# Patient Record
Sex: Female | Born: 1980 | Race: White | Hispanic: No | Marital: Married | Smoking: Never smoker
Health system: Southern US, Community
[De-identification: ages and names within clinical notes are randomized; demographics above are authoritative.]

## PROBLEM LIST (undated history)

## (undated) DIAGNOSIS — F32A Depression, unspecified: Secondary | ICD-10-CM

## (undated) DIAGNOSIS — R251 Tremor, unspecified: Secondary | ICD-10-CM

## (undated) DIAGNOSIS — T7840XA Allergy, unspecified, initial encounter: Secondary | ICD-10-CM

## (undated) DIAGNOSIS — E039 Hypothyroidism, unspecified: Secondary | ICD-10-CM

## (undated) DIAGNOSIS — Z87442 Personal history of urinary calculi: Secondary | ICD-10-CM

## (undated) DIAGNOSIS — F419 Anxiety disorder, unspecified: Secondary | ICD-10-CM

## (undated) DIAGNOSIS — M519 Unspecified thoracic, thoracolumbar and lumbosacral intervertebral disc disorder: Secondary | ICD-10-CM

## (undated) DIAGNOSIS — O039 Complete or unspecified spontaneous abortion without complication: Secondary | ICD-10-CM

## (undated) DIAGNOSIS — F329 Major depressive disorder, single episode, unspecified: Secondary | ICD-10-CM

## (undated) HISTORY — DX: Personal history of urinary calculi: Z87.442

## (undated) HISTORY — DX: Allergy, unspecified, initial encounter: T78.40XA

## (undated) HISTORY — PX: TUBAL LIGATION: SHX77

## (undated) HISTORY — DX: Unspecified thoracic, thoracolumbar and lumbosacral intervertebral disc disorder: M51.9

## (undated) HISTORY — PX: MOLE REMOVAL: SHX2046

## (undated) HISTORY — PX: LITHOTRIPSY: SUR834

## (undated) HISTORY — DX: Hypothyroidism, unspecified: E03.9

---
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
## 2004-06-22 ENCOUNTER — Other Ambulatory Visit: Admission: RE | Admit: 2004-06-22 | Discharge: 2004-06-22 | Payer: Self-pay | Admitting: Obstetrics and Gynecology

## 2006-01-09 ENCOUNTER — Inpatient Hospital Stay (HOSPITAL_COMMUNITY): Admission: AD | Admit: 2006-01-09 | Discharge: 2006-01-09 | Payer: Self-pay | Admitting: Internal Medicine

## 2006-03-08 ENCOUNTER — Inpatient Hospital Stay (HOSPITAL_COMMUNITY): Admission: AD | Admit: 2006-03-08 | Discharge: 2006-03-08 | Payer: Self-pay | Admitting: Obstetrics and Gynecology

## 2006-04-02 ENCOUNTER — Inpatient Hospital Stay (HOSPITAL_COMMUNITY): Admission: AD | Admit: 2006-04-02 | Discharge: 2006-04-04 | Payer: Self-pay | Admitting: Obstetrics and Gynecology

## 2006-05-18 ENCOUNTER — Emergency Department (HOSPITAL_COMMUNITY): Admission: EM | Admit: 2006-05-18 | Discharge: 2006-05-18 | Payer: Self-pay | Admitting: Emergency Medicine

## 2010-06-09 ENCOUNTER — Ambulatory Visit: Payer: Self-pay | Admitting: Obstetrics and Gynecology

## 2010-06-09 ENCOUNTER — Inpatient Hospital Stay (HOSPITAL_COMMUNITY): Admission: AD | Admit: 2010-06-09 | Discharge: 2010-06-10 | Payer: Self-pay | Admitting: Obstetrics and Gynecology

## 2010-12-02 NOTE — L&D Delivery Note (Signed)
Delivery Note Pt progressed rapidly to complete dilation after epidural.  At 12:37 PM a healthy female was delivered via  (Presentation: ;ROA).  APGAR: 8,9  weight 8 lb 15 oz (4054 g).   Placenta status  Delivered spontaneously and trailing membranes removed: , . Cord blood collected for donation.  Anesthesia: Epidural  Episiotomy: n/a Lacerations: none Suture Repair: n/a Est. Blood Loss (mL): 400cc  Mom to postpartum.  Baby to nursery-stable.  Julie Chambers 11/06/2011, 12:53 PM

## 2011-02-17 LAB — CBC
HCT: 36.4 % (ref 36.0–46.0)
Hemoglobin: 12.4 g/dL (ref 12.0–15.0)
MCV: 85.6 fL (ref 78.0–100.0)
Platelets: 212 10*3/uL (ref 150–400)
RDW: 13.9 % (ref 11.5–15.5)

## 2011-02-17 LAB — RH IMMUNE GLOBULIN WORKUP (NOT WOMEN'S HOSP)
ABO/RH(D): O NEG
Antibody Screen: NEGATIVE

## 2011-02-17 LAB — POCT PREGNANCY, URINE: Preg Test, Ur: POSITIVE

## 2011-04-08 LAB — RPR: RPR: NONREACTIVE

## 2011-04-08 LAB — GC/CHLAMYDIA PROBE AMP, GENITAL
Chlamydia: NEGATIVE
Gonorrhea: NEGATIVE

## 2011-04-08 LAB — ABO/RH: RH Type: NEGATIVE

## 2011-04-08 LAB — HIV ANTIBODY (ROUTINE TESTING W REFLEX): HIV: NONREACTIVE

## 2011-04-19 NOTE — Discharge Summary (Signed)
Julie Chambers, Julie Chambers                 ACCOUNT NO.:  [id number]   MEDICAL RECORD NO.:  [id number]          PATIENT TYPE:  INP   LOCATION:  9140                          FACILITY:  [facility]   PHYSICIAN:  Huel Cote, M.D. DATE OF BIRTH:  1980-12-30   DATE OF ADMISSION:  04/02/2006  DATE OF DISCHARGE:  04/04/2006                                 DISCHARGE SUMMARY   DISCHARGE DIAGNOSES:  1.  Term pregnancy at 40-2/7 weeks delivered.  2.  Status post normal spontaneous vaginal delivery.   DISCHARGE MEDICATIONS:  1.  Motrin 600 mg p.o. every 6 hours.  2.  Percocet 1-2 tablets p.o. every 4 hours p.r.n.   DISCHARGE FOLLOWUP:  The patient is to follow-up in the office in  approximately 6 weeks for her routine postpartum exam.   HOSPITAL COURSE:  The patient is a 30 year old, G1, P0 who was admitted at  40-2/7 weeks' gestation for induction of labor given post due date and  favorable cervix.  Prenatal care was complicated by hypothyroidism and  depression which were both stable on medications.  Prenatal labs are as  follows:  O negative, antibody negative, RPR nonreactive, rubella immune,  hepatitis B surface antigen negative, GC negative, chlamydia negative, one-  hour Glucola 108, group B strep negative.   PAST OBSTETRICAL HISTORY:  None.   PAST GYNECOLOGICAL HISTORY:  None.   PAST SURGICAL HISTORY:  None.   PAST MEDICAL HISTORY:  Hypothyroidism and depression.   ALLERGIES:  CODEINE.   MEDICATIONS INCLUDED:  1.  Wellbutrin 150 mg q.d.  2.  Levothroid 150 mcg q.d.   SUMMARY:  On admission, she was afebrile with stable vital signs.  Fetal  heart rate was reactive.  Cervix was 50, 1+ and -2 station.  She had rupture  of membranes performed and was placed on Pitocin.  She progressed well  throughout the day, reached complete dilation and pushed with normal  spontaneous vaginal delivery of a vigorous female infant over a first-degree  laceration.  Apgars were 8 and 9, weight was 8  pounds 10 ounces.  Placenta  delivered spontaneously.  A first-degree laceration was repaired 2-0 Vicryl  for hemostasis.  Estimated blood loss  was 350.  Cervix and rectum were intact.  The patient did very well.  Postpartum day #2, she had no complaints.  She was afebrile with stable  vital signs.  Discharge hemoglobin was 11.  Fundus was firm and lochia  normal.  Therefore, she was felt stable for discharge home and would  discharge for follow-up in six weeks.      Huel Cote, M.D.  Electronically Signed     KR/MEDQ  D:  04/28/2006  T:  04/28/2006  Job:  [id number]

## 2011-10-07 LAB — STREP B DNA PROBE: GBS: NEGATIVE

## 2011-11-01 ENCOUNTER — Encounter (HOSPITAL_COMMUNITY): Payer: Self-pay | Admitting: *Deleted

## 2011-11-01 ENCOUNTER — Telehealth (HOSPITAL_COMMUNITY): Payer: Self-pay | Admitting: *Deleted

## 2011-11-01 NOTE — Telephone Encounter (Signed)
Preadmission screen  

## 2011-11-05 ENCOUNTER — Other Ambulatory Visit: Payer: Self-pay | Admitting: Obstetrics and Gynecology

## 2011-11-06 ENCOUNTER — Encounter (HOSPITAL_COMMUNITY): Payer: Self-pay

## 2011-11-06 ENCOUNTER — Inpatient Hospital Stay (HOSPITAL_COMMUNITY)
Admission: RE | Admit: 2011-11-06 | Discharge: 2011-11-08 | DRG: 775 | Disposition: A | Discharge status: Home / Self Care | Payer: 59 | Source: Ambulatory Visit | Attending: Obstetrics and Gynecology | Admitting: Obstetrics and Gynecology

## 2011-11-06 ENCOUNTER — Encounter (HOSPITAL_COMMUNITY): Payer: Self-pay | Admitting: Anesthesiology

## 2011-11-06 ENCOUNTER — Inpatient Hospital Stay (HOSPITAL_COMMUNITY): Payer: 59 | Admitting: Anesthesiology

## 2011-11-06 DIAGNOSIS — O99284 Endocrine, nutritional and metabolic diseases complicating childbirth: Principal | ICD-10-CM | POA: Diagnosis present

## 2011-11-06 DIAGNOSIS — E079 Disorder of thyroid, unspecified: Principal | ICD-10-CM | POA: Diagnosis present

## 2011-11-06 DIAGNOSIS — E039 Hypothyroidism, unspecified: Secondary | ICD-10-CM | POA: Diagnosis present

## 2011-11-06 LAB — CBC
HCT: 33.6 % — ABNORMAL LOW (ref 36.0–46.0)
Hemoglobin: 11.7 g/dL — ABNORMAL LOW (ref 12.0–15.0)
MCH: 29.8 pg (ref 26.0–34.0)
MCHC: 34.8 g/dL (ref 30.0–36.0)
MCV: 85.7 fL (ref 78.0–100.0)
RDW: 13.5 % (ref 11.5–15.5)

## 2011-11-06 MED ORDER — IBUPROFEN 600 MG PO TABS: 600.0000 mg | ORAL_TABLET | Freq: Four times a day (QID) | ORAL | Status: DC | PRN

## 2011-11-06 MED ORDER — LANOLIN HYDROUS EX OINT: TOPICAL_OINTMENT | CUTANEOUS | Status: DC | PRN

## 2011-11-06 MED ORDER — RHO D IMMUNE GLOBULIN 1500 UNIT/2ML IJ SOLN
300.0000 ug | Freq: Once | INTRAMUSCULAR | Status: AC
  Administered 2011-11-07: 300 ug via INTRAMUSCULAR
  Filled 2011-11-06: qty 2

## 2011-11-06 MED ORDER — LACTATED RINGERS IV SOLN
INTRAVENOUS | Status: DC
  Administered 2011-11-06 (×3): via INTRAVENOUS

## 2011-11-06 MED ORDER — EPHEDRINE 5 MG/ML INJ: 10.0000 mg | INTRAVENOUS | Status: DC | PRN

## 2011-11-06 MED ORDER — LACTATED RINGERS IV SOLN: 500.0000 mL | Freq: Once | INTRAVENOUS | Status: DC

## 2011-11-06 MED ORDER — TERBUTALINE SULFATE 1 MG/ML IJ SOLN: 0.2500 mg | Freq: Once | INTRAMUSCULAR | Status: DC | PRN

## 2011-11-06 MED ORDER — DIBUCAINE 1 % RE OINT: 1.0000 "application " | TOPICAL_OINTMENT | RECTAL | Status: DC | PRN

## 2011-11-06 MED ORDER — OXYCODONE-ACETAMINOPHEN 5-325 MG PO TABS: 2.0000 | ORAL_TABLET | ORAL | Status: DC | PRN

## 2011-11-06 MED ORDER — OXYTOCIN 20 UNITS IN LACTATED RINGERS INFUSION - SIMPLE
1.0000 m[IU]/min | INTRAVENOUS | Status: DC
  Administered 2011-11-06: 333 m[IU]/min via INTRAVENOUS
  Administered 2011-11-06: 6 m[IU]/min via INTRAVENOUS
  Administered 2011-11-06: 2 m[IU]/min via INTRAVENOUS
  Filled 2011-11-06: qty 1000

## 2011-11-06 MED ORDER — ONDANSETRON HCL 4 MG PO TABS: 4.0000 mg | ORAL_TABLET | ORAL | Status: DC | PRN

## 2011-11-06 MED ORDER — WITCH HAZEL-GLYCERIN EX PADS: 1.0000 "application " | MEDICATED_PAD | CUTANEOUS | Status: DC | PRN

## 2011-11-06 MED ORDER — IBUPROFEN 600 MG PO TABS
600.0000 mg | ORAL_TABLET | Freq: Four times a day (QID) | ORAL | Status: DC
  Administered 2011-11-06 – 2011-11-08 (×7): 600 mg via ORAL
  Filled 2011-11-06 (×7): qty 1

## 2011-11-06 MED ORDER — BENZOCAINE-MENTHOL 20-0.5 % EX AERO: 1.0000 "application " | INHALATION_SPRAY | CUTANEOUS | Status: DC | PRN

## 2011-11-06 MED ORDER — ACETAMINOPHEN 325 MG PO TABS: 650.0000 mg | ORAL_TABLET | ORAL | Status: DC | PRN

## 2011-11-06 MED ORDER — ONDANSETRON HCL 4 MG/2ML IJ SOLN: 4.0000 mg | Freq: Four times a day (QID) | INTRAMUSCULAR | Status: DC | PRN

## 2011-11-06 MED ORDER — PHENYLEPHRINE 40 MCG/ML (10ML) SYRINGE FOR IV PUSH (FOR BLOOD PRESSURE SUPPORT): 80.0000 ug | PREFILLED_SYRINGE | INTRAVENOUS | Status: DC | PRN

## 2011-11-06 MED ORDER — CITRIC ACID-SODIUM CITRATE 334-500 MG/5ML PO SOLN: 30.0000 mL | ORAL | Status: DC | PRN

## 2011-11-06 MED ORDER — LIDOCAINE HCL (PF) 1 % IJ SOLN
30.0000 mL | INTRAMUSCULAR | Status: DC | PRN
  Filled 2011-11-06: qty 30

## 2011-11-06 MED ORDER — FENTANYL 2.5 MCG/ML BUPIVACAINE 1/10 % EPIDURAL INFUSION (WH - ANES)
14.0000 mL/h | INTRAMUSCULAR | Status: DC
  Filled 2011-11-06: qty 60

## 2011-11-06 MED ORDER — LIDOCAINE HCL 1.5 % IJ SOLN
INTRAMUSCULAR | Status: DC | PRN
  Administered 2011-11-06 (×2): 5 mL via EPIDURAL

## 2011-11-06 MED ORDER — LACTATED RINGERS IV SOLN
500.0000 mL | INTRAVENOUS | Status: DC | PRN
Start: 2011-11-06 — End: 2011-11-06

## 2011-11-06 MED ORDER — TETANUS-DIPHTH-ACELL PERTUSSIS 5-2.5-18.5 LF-MCG/0.5 IM SUSP
0.5000 mL | Freq: Once | INTRAMUSCULAR | Status: AC
  Administered 2011-11-07: 0.5 mL via INTRAMUSCULAR
  Filled 2011-11-06: qty 0.5

## 2011-11-06 MED ORDER — OXYCODONE-ACETAMINOPHEN 5-325 MG PO TABS: 1.0000 | ORAL_TABLET | ORAL | Status: DC | PRN

## 2011-11-06 MED ORDER — DIPHENHYDRAMINE HCL 50 MG/ML IJ SOLN: 12.5000 mg | INTRAMUSCULAR | Status: DC | PRN

## 2011-11-06 MED ORDER — LEVOTHYROXINE SODIUM 112 MCG PO TABS
224.0000 ug | ORAL_TABLET | Freq: Every day | ORAL | Status: DC
  Administered 2011-11-07 – 2011-11-08 (×2): 224 ug via ORAL
  Filled 2011-11-06 (×3): qty 2

## 2011-11-06 MED ORDER — ONDANSETRON HCL 4 MG/2ML IJ SOLN: 4.0000 mg | INTRAMUSCULAR | Status: DC | PRN

## 2011-11-06 MED ORDER — ZOLPIDEM TARTRATE 5 MG PO TABS: 5.0000 mg | ORAL_TABLET | Freq: Every evening | ORAL | Status: DC | PRN

## 2011-11-06 MED ORDER — SENNOSIDES-DOCUSATE SODIUM 8.6-50 MG PO TABS
2.0000 | ORAL_TABLET | Freq: Every day | ORAL | Status: DC
  Administered 2011-11-06 – 2011-11-07 (×2): 2 via ORAL

## 2011-11-06 MED ORDER — SIMETHICONE 80 MG PO CHEW: 80.0000 mg | CHEWABLE_TABLET | ORAL | Status: DC | PRN

## 2011-11-06 MED ORDER — OXYTOCIN BOLUS FROM INFUSION
500.0000 mL | Freq: Once | INTRAVENOUS | Status: DC
  Filled 2011-11-06: qty 500

## 2011-11-06 MED ORDER — EPHEDRINE 5 MG/ML INJ
10.0000 mg | INTRAVENOUS | Status: DC | PRN
  Filled 2011-11-06: qty 4

## 2011-11-06 MED ORDER — PRENATAL PLUS 27-1 MG PO TABS
1.0000 | ORAL_TABLET | Freq: Every day | ORAL | Status: DC
  Administered 2011-11-06 – 2011-11-08 (×3): 1 via ORAL
  Filled 2011-11-06 (×3): qty 1

## 2011-11-06 MED ORDER — OXYTOCIN 10 UNIT/ML IJ SOLN
INTRAMUSCULAR | Status: AC
  Filled 2011-11-06: qty 2

## 2011-11-06 MED ORDER — DIPHENHYDRAMINE HCL 25 MG PO CAPS: 25.0000 mg | ORAL_CAPSULE | Freq: Four times a day (QID) | ORAL | Status: DC | PRN

## 2011-11-06 MED ORDER — OXYTOCIN 20 UNITS IN LACTATED RINGERS INFUSION - SIMPLE: 125.0000 mL/h | Freq: Once | INTRAVENOUS | Status: DC

## 2011-11-06 MED ORDER — FENTANYL 2.5 MCG/ML BUPIVACAINE 1/10 % EPIDURAL INFUSION (WH - ANES)
INTRAMUSCULAR | Status: DC | PRN
  Administered 2011-11-06: 13 mL/h via EPIDURAL

## 2011-11-06 MED ORDER — FLEET ENEMA 7-19 GM/118ML RE ENEM: 1.0000 | ENEMA | RECTAL | Status: DC | PRN

## 2011-11-06 MED ORDER — PHENYLEPHRINE 40 MCG/ML (10ML) SYRINGE FOR IV PUSH (FOR BLOOD PRESSURE SUPPORT)
80.0000 ug | PREFILLED_SYRINGE | INTRAVENOUS | Status: DC | PRN
  Filled 2011-11-06: qty 5

## 2011-11-06 NOTE — Anesthesia Preprocedure Evaluation (Signed)
Anesthesia Evaluation  Patient identified by MRN, date of birth, ID band Patient awake    Reviewed: Allergy & Precautions, H&P , NPO status , Patient's Chart, lab work & pertinent test results  Airway Mallampati: II TM Distance: >3 FB Neck ROM: full    Dental No notable dental hx.    Pulmonary    Pulmonary exam normal       Cardiovascular neg cardio ROS     Neuro/Psych Negative Neurological ROS  Negative Psych ROS   GI/Hepatic negative GI ROS, Neg liver ROS,   Endo/Other  Hypothyroidism Morbid obesity  Renal/GU negative Renal ROS  Genitourinary negative   Musculoskeletal negative musculoskeletal ROS (+)   Abdominal (+) obese,   Peds negative pediatric ROS (+)  Hematology negative hematology ROS (+)   Anesthesia Other Findings   Reproductive/Obstetrics (+) Pregnancy                           Anesthesia Physical Anesthesia Plan  ASA: III  Anesthesia Plan: Epidural   Post-op Pain Management:    Induction:   Airway Management Planned:   Additional Equipment:   Intra-op Plan:   Post-operative Plan:   Informed Consent: I have reviewed the patients History and Physical, chart, labs and discussed the procedure including the risks, benefits and alternatives for the proposed anesthesia with the patient or authorized representative who has indicated his/her understanding and acceptance.     Plan Discussed with:   Anesthesia Plan Comments:         Anesthesia Quick Evaluation

## 2011-11-06 NOTE — Anesthesia Postprocedure Evaluation (Signed)
Anesthesia Post Note  Patient: Julie Chambers  Procedure(s) Performed: * No procedures listed *  Anesthesia type: Epidural  Patient location: Mother/Baby  Post pain: Pain level controlled  Post assessment: Post-op Vital signs reviewed  Last Vitals:  Filed Vitals:   11/06/11 1417  BP: 134/66  Pulse: 91  Temp:   Resp: 20    Post vital signs: Reviewed  Level of consciousness: awake  Complications: No apparent anesthesia complications

## 2011-11-06 NOTE — Anesthesia Procedure Notes (Signed)
Epidural Patient location during procedure: OB Start time: 11/06/2011 9:48 AM End time: 11/06/2011 9:54 AM Reason for block: procedure for pain  Staffing Anesthesiologist: Sandrea Hughs Performed by: anesthesiologist   Preanesthetic Checklist Completed: patient identified, site marked, surgical consent, pre-op evaluation, timeout performed, IV checked, risks and benefits discussed and monitors and equipment checked  Epidural Patient position: sitting Prep: site prepped and draped and DuraPrep Patient monitoring: continuous pulse ox and blood pressure Approach: midline Injection technique: LOR air  Needle:  Needle type: Tuohy  Needle gauge: 17 G Needle length: 9 cm Needle insertion depth: 7 cm Catheter type: closed end flexible Catheter size: 19 Gauge Catheter at skin depth: 13 cm Test dose: negative and 1.5% lidocaine  Assessment Sensory level: T8 Events: blood not aspirated, injection not painful, no injection resistance, negative IV test and no paresthesia

## 2011-11-06 NOTE — H&P (Signed)
Julie Chambers is a 30 y.o. female G3P1011 at 40 weeks (EDD 11/06/11 by 5 week [place])   presenting for induction of labor at due date with a favorable cervix.  Prenatal care has been uneventful. EFW 8 1/2-9 lbs, but pelvis tested to 8#10oz with first delivery. Rh negative and received rhogam.  History OB History    Grav Para Term Preterm Abortions TAB SAB Ect Mult Living   3 1 1  1  0 1   1    NSVD 8#10oz 2007 SAB 2011  Past Medical History  Diagnosis Date  . Asthma   . Hypothyroidism   . Ruptured intervertebral disc     lumbar lowest in spine happened in high school and again as young adul no surgery only physical therapy  . History of kidney stones    Past Surgical History  Procedure Date  . Mole removal   . Lithotripsy    Family History: family history includes COPD in her maternal grandmother; Cancer in her brother and father; Depression in her mother; Diabetes in her maternal grandmother; Hypertension in her maternal grandmother; and Mental illness in her sister.  There is no history of Anesthesia problems, and Hypotension, and Pseudochol deficiency, and Malignant hyperthermia, . Social History:  reports that she has never smoked. She has never used smokeless tobacco. She reports that she does not drink alcohol or use illicit drugs.  ROS  Dilation: 2.5 Effacement (%): 50 Station: -2 Exam by:: Dr. Senaida Ores SROM clear  Blood pressure 120/71, pulse 88, temperature 97.9 F (36.6 C), temperature source Oral, resp. rate 20, height 5\' 5"  (1.651 m), weight 107.956 kg (238 lb), last menstrual period 01/13/2011. Maternal Exam:  Uterine Assessment: Contraction strength is mild.  Contraction frequency is irregular.   Abdomen: Fetal presentation: vertex  Introitus: Normal vulva. Normal vagina.    Physical Exam  Constitutional: She is oriented to person, place, and time. She appears well-developed and well-nourished.  Cardiovascular: Normal rate and regular rhythm.   Respiratory:  Effort normal and breath sounds normal.  Genitourinary:       Cervix 50/2-3/-2  Neurological: She is alert and oriented to person, place, and time.  Psychiatric: She has a normal mood and affect. Her behavior is normal.    Prenatal labs: ABO, Rh: O/Negative/-- (05/07 0000) Antibody:  negative Rubella: Immune (05/07 0000) RPR: Nonreactive (05/07 0000)  HBsAg: Negative (05/07 0000)  HIV: Non-reactive (05/07 0000)  GBS: Negative (11/05 0000)  One hour glucola 129 Declined genetic screens  Assessment/Plan: Pt on pitocin and plans epidural when active.   Julie Chambers 11/06/2011, 8:40 AM

## 2011-11-07 LAB — CBC
MCH: 29.6 pg (ref 26.0–34.0)
MCHC: 34.1 g/dL (ref 30.0–36.0)
MCV: 86.8 fL (ref 78.0–100.0)
Platelets: 174 10*3/uL (ref 150–400)

## 2011-11-07 MED ORDER — PSEUDOEPHEDRINE HCL 30 MG PO TABS
30.0000 mg | ORAL_TABLET | Freq: Four times a day (QID) | ORAL | Status: DC | PRN
  Administered 2011-11-07 (×2): 30 mg via ORAL
  Filled 2011-11-07 (×2): qty 1

## 2011-11-07 NOTE — Progress Notes (Signed)
Post Partum Day 1 Subjective: no complaints, up ad lib and tolerating PO  Objective: Blood pressure 107/71, pulse 90, temperature 97.3 F (36.3 C), temperature source Oral, resp. rate 18, height 5\' 5"  (1.651 m), weight 107.956 kg (238 lb), last menstrual period 01/13/2011, SpO2 95.00%, unknown if currently breastfeeding.  Physical Exam:  General: alert and no distress Lochia: appropriate Uterine Fundus: firm    Basename 11/07/11 0505 11/06/11 0755  HGB 9.0* 11.7*  HCT 26.4* 33.6*    Assessment/Plan: Plan for discharge tomorrow  Baby with double bili lights, will do circ tomorrow   LOS: 1 day   BOVARD,[place]en Klauer 11/07/2011, 8:45 AM

## 2011-11-07 NOTE — Anesthesia Postprocedure Evaluation (Signed)
  Anesthesia Post-op Note  Patient: Julie Chambers  Procedure(s) Performed: * No procedures listed *  Patient Location: Mother/Baby  Anesthesia Type: Epidural  Level of Consciousness: awake, alert  and oriented  Airway and Oxygen Therapy: Patient Spontanous Breathing  Post-op Pain: none  Post-op Assessment: Post-op Vital signs reviewed, Patient's Cardiovascular Status Stable, No headache, No backache, No residual numbness and No residual motor weakness  Post-op Vital Signs: Reviewed and stable  Complications: No apparent anesthesia complications

## 2011-11-07 NOTE — Addendum Note (Signed)
Addendum  created 11/07/11 0754 by Madison Hickman   Modules edited:Charges VN, Notes Section

## 2011-11-08 LAB — RH IG WORKUP (INCLUDES ABO/RH)
Fetal Screen: NEGATIVE
Gestational Age(Wks): 40
Unit division: 0

## 2011-11-08 MED ORDER — IBUPROFEN 800 MG PO TABS: 800.0000 mg | ORAL_TABLET | Freq: Three times a day (TID) | ORAL | Status: AC | PRN

## 2011-11-08 MED ORDER — OXYCODONE-ACETAMINOPHEN 5-325 MG PO TABS: 1.0000 | ORAL_TABLET | ORAL | Status: AC | PRN

## 2011-11-08 MED ORDER — PRENATAL PLUS 27-1 MG PO TABS: 1.0000 | ORAL_TABLET | Freq: Every day | ORAL | Status: DC

## 2011-11-08 NOTE — Progress Notes (Signed)
Post Partum Day 2 Subjective: no complaints, tolerating PO, + flatus and nl lochia  Objective: Blood pressure 102/67, pulse 82, temperature 97.8 F (36.6 C), temperature source Axillary, resp. rate 18, height 5\' 5"  (1.651 m), weight 107.956 kg (238 lb), last menstrual period 01/13/2011, SpO2 95.00%, unknown if currently breastfeeding.  Physical Exam:  General: alert and no distress Lochia: appropriate Uterine Fundus: firm    Basename 11/07/11 0505 11/06/11 0755  HGB 9.0* 11.7*  HCT 26.4* 33.6*    Assessment/Plan: Discharge home and Circumcision prior to discharge   LOS: 2 days   BOVARD,Tiffanie Blassingame 11/08/2011, 8:25 AM

## 2011-11-08 NOTE — Discharge Summary (Signed)
Obstetric Discharge Summary Reason for Admission: induction of labor Prenatal Procedures: none Intrapartum Procedures: spontaneous vaginal delivery Postpartum Procedures: none Complications-Operative and Postpartum: none Hemoglobin  Date Value Range Status  11/07/2011 9.0* 12.0-15.0 (g/dL) Final     DELTA CHECK NOTED     REPEATED TO VERIFY     HCT  Date Value Range Status  11/07/2011 26.4* 36.0-46.0 (%) Final    Discharge Diagnoses: Term Pregnancy-delivered  Discharge Information: Date: 11/08/2011 Activity: pelvic rest Diet: routine Medications: PNV, Ibuprofen and Percocet Condition: stable Instructions: refer to practice specific booklet Discharge to: home   Newborn Data: Live born female  Birth Weight: 8 lb 15 oz (4054 g) APGAR: 9, 10  Home with mother.  BOVARD,Torez Beauregard 11/08/2011, 9:08 AM

## 2012-02-12 ENCOUNTER — Ambulatory Visit (INDEPENDENT_AMBULATORY_CARE_PROVIDER_SITE_OTHER): Payer: 59 | Admitting: Physician Assistant

## 2012-02-12 VITALS — BP 123/80 | HR 111 | Temp 99.1°F | Resp 16 | Ht 64.25 in | Wt 209.4 lb

## 2012-02-12 DIAGNOSIS — R05 Cough: Secondary | ICD-10-CM

## 2012-02-12 DIAGNOSIS — J019 Acute sinusitis, unspecified: Secondary | ICD-10-CM

## 2012-02-12 DIAGNOSIS — R509 Fever, unspecified: Secondary | ICD-10-CM

## 2012-02-12 DIAGNOSIS — J029 Acute pharyngitis, unspecified: Secondary | ICD-10-CM

## 2012-02-12 LAB — POCT CBC
MCH, POC: 26.3 pg — AB (ref 27–31.2)
MID (cbc): 0.7 (ref 0–0.9)
MPV: 9 fL (ref 0–99.8)
POC LYMPH PERCENT: 14.6 %L (ref 10–50)
POC MID %: 5.6 %M (ref 0–12)
Platelet Count, POC: 251 10*3/uL (ref 142–424)
RBC: 4.9 M/uL (ref 4.04–5.48)
RDW, POC: 14.8 %
WBC: 13 10*3/uL — AB (ref 4.6–10.2)

## 2012-02-12 LAB — POCT RAPID STREP A (OFFICE): Rapid Strep A Screen: NEGATIVE

## 2012-02-12 MED ORDER — AMOXICILLIN 875 MG PO TABS: 875.0000 mg | ORAL_TABLET | Freq: Two times a day (BID) | ORAL | Status: DC

## 2012-02-12 NOTE — Progress Notes (Signed)
Patient ID: AZIYAH PROVENCAL MRN: [id number], DOB: [birth year], 31 y.o. Date of Encounter: 02/12/2012, 3:33 PM  Primary Physician: No primary provider on file.  Chief Complaint:  Chief Complaint  Patient presents with  . Sinusitis    x 1 week  . Sore Throat    x 2 days    HPI: 31 y.o. year old female presents with 7 day history of nasal congestion, post nasal drip, sore throat, sinus pressure, and cough. Subject fever and chills with myalgias. Nasal congestion thick and green/yellow. Cough is productive of green/yellow sputum and worse first thing in the morning and at night. No SOB or wheezing. Ears feel full, leading to sensation of muffled hearing. Has tried OTC cold preps without success. No GI complaints. Appetite ok. Multiple sick contacts at home with similar symptoms. No recent antibiotics, or recent travels.  Currently breast feeding.  No leg trauma, sedentary periods, h/o cancer, or tobacco use.  Past Medical History  Diagnosis Date  . Asthma   . Hypothyroidism   . Ruptured intervertebral disc     lumbar lowest in spine happened in high school and again as young adul no surgery only physical therapy  . History of kidney stones      Home Meds: Prior to Admission medications   Medication Sig Start Date End Date Taking? Authorizing Provider  levothyroxine (SYNTHROID, LEVOTHROID) 112 MCG tablet Take 224 mcg by mouth daily.     Yes Historical Provider, MD    Allergies: No Known Allergies  History   Social History  . Marital Status: Married    Spouse Name: N/A    Number of Children: N/A  . Years of Education: N/A   Occupational History  . Not on file.   Social History Main Topics  . Smoking status: Never Smoker   . Smokeless tobacco: Never Used  . Alcohol Use: No  . Drug Use: No  . Sexually Active: Yes   Other Topics Concern  . Not on file   Social History Narrative  . No narrative on file     Review of Systems: Constitutional: negative for night  sweats or weight changes Cardiovascular: negative for chest pain or palpitations Respiratory: negative for hemoptysis, wheezing, or shortness of breath Abdominal: negative for abdominal pain, nausea, vomiting or diarrhea Dermatological: negative for rash Neurologic: negative for headache   Physical Exam: Blood pressure 123/80, pulse 111, temperature 99.1 F (37.3 C), temperature source Oral, resp. rate 16, height 5' 4.25" (1.632 m), weight 209 lb 6.4 oz (94.983 kg), last menstrual period 02/12/2011, currently breastfeeding., Body mass index is 35.66 kg/(m^2). General: Well developed, well nourished, in no acute distress. Head: Normocephalic, atraumatic, eyes without discharge, sclera non-icteric, nares are congested. Bilateral auditory canals clear, TM's are without perforation, pearly grey with reflective cone of light bilaterally. Serous effusion bilaterally behind TM's. Maxillary sinus TTP. Oral cavity moist, dentition normal. Posterior pharynx with post nasal drip and mild erythema. No peritonsillar abscess or tonsillar exudate. Neck: Supple. No thyromegaly. Full ROM. No lymphadenopathy. Lungs: Clear bilaterally to auscultation without wheezes, rales, or rhonchi. Breathing is unlabored.  Heart: RRR with S1 S2. No murmurs, rubs, or gallops appreciated. Msk:  Strength and tone normal for age. Extremities: No clubbing or cyanosis. No edema. Neuro: Alert and oriented X 3. Moves all extremities spontaneously. CNII-XII grossly in tact. Psych:  Responds to questions appropriately with a normal affect.   Labs: Results for orders placed in visit on 02/12/12  POCT CBC  Component Value Range   WBC 13.0 (*) 4.6 - 10.2 (K/uL)   Lymph, poc 1.9  0.6 - 3.4    POC LYMPH PERCENT 14.6  10 - 50 (%L)   MID (cbc) 0.7  0 - 0.9    POC MID % 5.6  0 - 12 (%M)   POC Granulocyte 10.4 (*) 2 - 6.9    Granulocyte percent 79.8  37 - 80 (%G)   RBC 4.90  4.04 - 5.48 (M/uL)   Hemoglobin 12.9  12.2 - 16.2  (g/dL)   HCT, POC 16.1  09.6 - 47.9 (%)   MCV 81.9  80 - 97 (fL)   MCH, POC 26.3 (*) 27 - 31.2 (pg)   MCHC 32.2  31.8 - 35.4 (g/dL)   RDW, POC 04.5     Platelet Count, POC 251  142 - 424 (K/uL)   MPV 9.0  0 - 99.8 (fL)  POCT INFLUENZA A/B      Component Value Range   Influenza A, POC Negative     Influenza B, POC Negative    POCT RAPID STREP A (OFFICE)      Component Value Range   Rapid Strep A Screen Negative  Negative    TC pending  ASSESSMENT AND PLAN:  31 y.o. year old female with sinusitis secondary to viral URI -Amoxicillin 875 mg 1 po bid #20 no RF -Tylenol -Rest/fluids -Await throat culture results -RTC precautions -RTC 3-5 days if no improvement  Signed, Eula Listen, PA-C 02/12/2012 3:33 PM

## 2012-02-24 ENCOUNTER — Emergency Department (HOSPITAL_COMMUNITY): Payer: 59

## 2012-02-24 ENCOUNTER — Encounter (HOSPITAL_COMMUNITY): Payer: Self-pay | Admitting: Emergency Medicine

## 2012-02-24 ENCOUNTER — Emergency Department (HOSPITAL_COMMUNITY)
Admission: EM | Admit: 2012-02-24 | Discharge: 2012-02-24 | Disposition: A | Discharge status: Home / Self Care | Payer: 59 | Attending: Emergency Medicine | Admitting: Emergency Medicine

## 2012-02-24 DIAGNOSIS — M545 Low back pain, unspecified: Secondary | ICD-10-CM | POA: Insufficient documentation

## 2012-02-24 DIAGNOSIS — J45909 Unspecified asthma, uncomplicated: Secondary | ICD-10-CM | POA: Insufficient documentation

## 2012-02-24 DIAGNOSIS — X500XXA Overexertion from strenuous movement or load, initial encounter: Secondary | ICD-10-CM | POA: Insufficient documentation

## 2012-02-24 DIAGNOSIS — S339XXA Sprain of unspecified parts of lumbar spine and pelvis, initial encounter: Secondary | ICD-10-CM | POA: Insufficient documentation

## 2012-02-24 DIAGNOSIS — Z79899 Other long term (current) drug therapy: Secondary | ICD-10-CM | POA: Insufficient documentation

## 2012-02-24 DIAGNOSIS — S39012A Strain of muscle, fascia and tendon of lower back, initial encounter: Secondary | ICD-10-CM

## 2012-02-24 DIAGNOSIS — E039 Hypothyroidism, unspecified: Secondary | ICD-10-CM | POA: Insufficient documentation

## 2012-02-24 MED ORDER — ONDANSETRON 4 MG PO TBDP
4.0000 mg | ORAL_TABLET | Freq: Once | ORAL | Status: AC
  Administered 2012-02-24: 4 mg via ORAL
  Filled 2012-02-24: qty 1

## 2012-02-24 MED ORDER — CYCLOBENZAPRINE HCL 10 MG PO TABS: 10.0000 mg | ORAL_TABLET | Freq: Two times a day (BID) | ORAL | Status: AC | PRN

## 2012-02-24 MED ORDER — OXYCODONE-ACETAMINOPHEN 5-325 MG PO TABS
2.0000 | ORAL_TABLET | Freq: Once | ORAL | Status: AC
  Administered 2012-02-24: 2 via ORAL
  Filled 2012-02-24: qty 2

## 2012-02-24 MED ORDER — OXYCODONE-ACETAMINOPHEN 5-325 MG PO TABS: 1.0000 | ORAL_TABLET | Freq: Four times a day (QID) | ORAL | Status: AC | PRN

## 2012-02-24 NOTE — ED Notes (Signed)
Pt states she would rather go home than stay for pain management

## 2012-02-24 NOTE — ED Notes (Signed)
Per EMS.  Pt has hx of ruptured disc.  When picking up her infant in a carseat she felt her lumbar spine pop followed by a burning pain.  This changed into a sharp throbbing pain.  No hx of surgery.

## 2012-02-24 NOTE — ED Provider Notes (Signed)
History     CSN: [id number]  Arrival date & time 02/24/12  1241   First MD Initiated Contact with Patient 02/24/12 1255      Chief Complaint  Patient presents with  . Back Pain    (Consider location/radiation/quality/duration/timing/severity/associated sxs/prior treatment) HPI  31 year old female with history of ruptured intravertebral disc, and history of kidney stones, is presenting to the ED with chief complaints of back pain. Patient states she was attempting to lift her child from a car seat when she felt an acute onset of sharp pain to her low back. Described pain as a sharp, throbbing and shooting sensation in the same area that she has to prior ruptured disc. She also initially heard a pop during lifting the car seat.  Pain has been persistent, worsening with movement. She denies any numbness sensation. Patient denies any other prior symptoms. Denies fever, chest pain, shortness of breath, abdominal pain, dysuria, hematuria. Denies any other red flags finding. States she was unable to walk due to pain, has called EMS to bring her to the ED. She denies any prior surgical procedure she had a ruptured intravertebral disc back in 2001.  She had physical therapy  Past Medical History  Diagnosis Date  . Asthma   . Hypothyroidism   . Ruptured intervertebral disc     lumbar lowest in spine happened in high school and again as young adul no surgery only physical therapy  . History of kidney stones     Past Surgical History  Procedure Date  . Mole removal   . Lithotripsy     Family History  Problem Relation Age of Onset  . Depression Mother   . Cancer Father     non hodgkins lymphoma  . Mental illness Sister     ocd  . Cancer Brother     neuroblastoma  . Diabetes Maternal Grandmother   . COPD Maternal Grandmother   . Hypertension Maternal Grandmother   . Anesthesia problems Neg Hx   . Hypotension Neg Hx   . Pseudochol deficiency Neg Hx   . Malignant hyperthermia Neg Hx      History  Substance Use Topics  . Smoking status: Never Smoker   . Smokeless tobacco: Never Used  . Alcohol Use: No    OB History    Grav Para Term Preterm Abortions TAB SAB Ect Mult Living   3 2 2  1  0 1   2      Review of Systems  All other systems reviewed and are negative.    Allergies  Review of patient's allergies indicates no known allergies.  Home Medications   Current Outpatient Rx  Name Route Sig Dispense Refill  . LEVOTHYROXINE SODIUM 112 MCG PO TABS Oral Take 224 mcg by mouth daily.        BP 128/64  Pulse 64  Resp 18  SpO2 100%  LMP 02/12/2011  Breastfeeding? Yes  Physical Exam  Nursing note and vitals reviewed. Constitutional: She appears well-developed and well-nourished. No distress.       Awake, alert, nontoxic appearance. Tearful  HENT:  Head: Atraumatic.  Eyes: Conjunctivae are normal. Right eye exhibits no discharge. Left eye exhibits no discharge.  Neck: Neck supple.  Cardiovascular: Normal rate and regular rhythm.   Pulmonary/Chest: Effort normal. No respiratory distress. She exhibits no tenderness.  Abdominal: Soft. There is no tenderness. There is no rebound.  Musculoskeletal: She exhibits no tenderness.       Cervical back: Normal.  Thoracic back: Normal.       Lumbar back: She exhibits decreased range of motion, tenderness and bony tenderness. She exhibits no swelling, no edema, no deformity and no laceration.       ROM appears intact, no obvious focal weakness.  Pain noted to mid line lumbar region with no evidence of step-off, or overlying skin changes. No rash. No CVA tenderness  Neurological: She has normal reflexes.       Mental status and motor strength appears intact  Skin: Skin is warm. No rash noted.  Psychiatric: She has a normal mood and affect.    ED Course  Procedures (including critical care time)  Labs Reviewed - No data to display No results found.   No diagnosis found.  Results for orders placed  during the hospital encounter of 02/24/12  POCT PREGNANCY, URINE      Component Value Range   Preg Test, Ur NEGATIVE  NEGATIVE    Dg Lumbar Spine Complete  02/24/2012  *RADIOLOGY REPORT*  Clinical Data: Back pain  LUMBAR SPINE - COMPLETE 4+ VIEW  Comparison: 06/09/2010  Findings: There is no evidence of lumbar spine fracture.  Alignment is normal.  Intervertebral disc spaces are maintained.  IMPRESSION: Negative exam.  Original Report Authenticated By: Rosealee Albee, M.D.      MDM  Acute onset of low back pain secondary to lifting. Prior history of ruptured disc. No obvious red flags finding. Will obtain x-ray of low back, pain medication and antinausea medication for symptom relief. We'll continue to monitor  3:31 PM Back pain has improved with current treatment. X-ray shows no acute finding. No red flag findings. Patient is currently breast-feeding, however she does request for pain medication. Recommend for her to switch to formula while taking pain medication, which patient agrees. Will ambulate patient prior to discharge.  4:04 PM Able to ambulate although with discomfort.  Pt sts she would prefers to be discharged with pain medications.  Recommend strict f/u.  Pt voice understanding.     Fayrene Helper, PA-C 02/24/12 1604

## 2012-02-24 NOTE — Discharge Instructions (Signed)
Muscle Strain A muscle strain, or pulled muscle, occurs when a muscle is over-stretched. A small number of muscle fibers may also be torn. This is especially common in athletes. This happens when a sudden violent force placed on a muscle pushes it past its capacity. Usually, recovery from a pulled muscle takes 1 to 2 weeks. But complete healing will take 5 to 6 weeks. There are millions of muscle fibers. Following injury, your body will usually return to normal quickly. HOME CARE INSTRUCTIONS   While awake, apply ice to the sore muscle for 15 to 20 minutes each hour for the first 2 days. Put ice in a plastic bag and place a towel between the bag of ice and your skin.   Do not use the pulled muscle for several days. Do not use the muscle if you have pain.   You may wrap the injured area with an elastic bandage for comfort. Be careful not to bind it too tightly. This may interfere with blood circulation.   Only take over-the-counter or prescription medicines for pain, discomfort, or fever as directed by your caregiver. Do not use aspirin as this will increase bleeding (bruising) at injury site.   Warming up before exercise helps prevent muscle strains.  SEEK MEDICAL CARE IF:  There is increased pain or swelling in the affected area. MAKE SURE YOU:   Understand these instructions.   Will watch your condition.   Will get help right away if you are not doing well or get worse.  Document Released: 11/18/2005 Document Revised: 11/07/2011 Document Reviewed: 06/17/2007 [employer] Patient Information 2012 [employer]. 

## 2012-02-24 NOTE — ED Notes (Signed)
Bed:[facility]<BR> Expected date:<BR> Expected time:12:24 PM<BR> Means of arrival:Ambulance<BR> Comments:<BR> PTAR 26 -- Back Pain

## 2012-02-24 NOTE — Progress Notes (Signed)
Pt confirms she does not have a pcp nor pain management [employer] MD.  CM reviewed with pt how to obtain an in network pcp and/or pain management MD via her customer service number or web site on back of her insurance card.

## 2012-02-24 NOTE — ED Notes (Signed)
Attempted to ambulate.  Pt unsteady and had to hold onto walls and husband.

## 2012-02-25 NOTE — ED Provider Notes (Signed)
Medical screening examination/treatment/procedure(s) were performed by non-physician practitioner and as supervising physician I was immediately available for consultation/collaboration.   Dayton Bailiff, MD 02/25/12 [phone]

## 2013-03-20 ENCOUNTER — Ambulatory Visit (INDEPENDENT_AMBULATORY_CARE_PROVIDER_SITE_OTHER): Payer: Managed Care, Other (non HMO) | Admitting: Physician Assistant

## 2013-03-20 VITALS — BP 120/86 | HR 91 | Temp 98.5°F | Resp 20 | Ht 65.0 in | Wt 232.0 lb

## 2013-03-20 DIAGNOSIS — E039 Hypothyroidism, unspecified: Secondary | ICD-10-CM

## 2013-03-20 DIAGNOSIS — F419 Anxiety disorder, unspecified: Secondary | ICD-10-CM

## 2013-03-20 DIAGNOSIS — R062 Wheezing: Secondary | ICD-10-CM

## 2013-03-20 DIAGNOSIS — F411 Generalized anxiety disorder: Secondary | ICD-10-CM

## 2013-03-20 DIAGNOSIS — J45909 Unspecified asthma, uncomplicated: Secondary | ICD-10-CM

## 2013-03-20 MED ORDER — ALBUTEROL SULFATE (2.5 MG/3ML) 0.083% IN NEBU
2.5000 mg | INHALATION_SOLUTION | Freq: Once | RESPIRATORY_TRACT | Status: AC
  Administered 2013-03-20: 2.5 mg via RESPIRATORY_TRACT

## 2013-03-20 MED ORDER — ALBUTEROL SULFATE HFA 108 (90 BASE) MCG/ACT IN AERS: 2.0000 | INHALATION_SPRAY | RESPIRATORY_TRACT | Status: DC | PRN

## 2013-03-20 MED ORDER — IPRATROPIUM BROMIDE 0.02 % IN SOLN
0.5000 mg | Freq: Once | RESPIRATORY_TRACT | Status: AC
  Administered 2013-03-20: 0.5 mg via RESPIRATORY_TRACT

## 2013-03-20 MED ORDER — CITALOPRAM HYDROBROMIDE 20 MG PO TABS: 20.0000 mg | ORAL_TABLET | Freq: Every day | ORAL | Status: DC

## 2013-03-20 NOTE — Patient Instructions (Addendum)
Levothyroxine should be taken on an empty stomach, either 1 hour before, or 1 hours after anything else, including food. Take loratadine every day for the next 1-2 weeks, then as needed for your known allergy/asthma triggers. If you use the rescue inhaler and it doesn't work, you need medical evaluation promptly.    I recommend Arbutus Ped for counseling.  When you call her, you'll have to leave a message and she'll call you back.  In the message, let her know that I recommended her to you. [facility] [phone], [address]  I'm at the walk-in office 5/17 and 5/18, and 5/31 and 6/01.

## 2013-03-20 NOTE — Progress Notes (Signed)
Subjective:    Patient ID: Julie Chambers, female    DOB: 08-27-1981, 32 y.o.   MRN: [id number]  HPI This 32 y.o. female presents for evaluation of shortness of breath. She is brought back and seen acutely due to shortness of breath.  Her symptoms developed last night, about midnight, after spending the evening with friends with a dog (a known trigger of her asthma).  She was unable to sleep due to the shortness of breath and found little benefit from her rescue inhaler.  This is the typical scenario for her-asymptomatic until her symptoms develop suddenly, quite severe ("I have to use my inhaler, like 8 times an hour").    Also, asks about treatment for anxiety. "I feel like I'm going crazy." Gets emotional. "My responses are more than needed." Increased crying and irritability.  History of similar symptoms, even given an Rx for citalopram by OBGYN, but didn't take it ("I just don't like to take medications.  I've even tried to wean myself off the thyroid medication, which is probably why I don't get along with [facility] endocrinologist].") Reports 12+ years of intermittent symptom exacerbation.  Multiple family members with OCD.   Past Medical History  Diagnosis Date  . Asthma   . Hypothyroidism   . Ruptured intervertebral disc     lumbar lowest in spine happened in high school and again as young adul no surgery only physical therapy  . History of kidney stones   . Allergy     Past Surgical History  Procedure Laterality Date  . Mole removal    . Lithotripsy      Prior to Admission medications   Medication Sig Start Date End Date Taking? Authorizing Provider  albuterol (PROVENTIL HFA;VENTOLIN HFA) 108 (90 BASE) MCG/ACT inhaler Inhale 2 puffs into the lungs every 4 (four) hours as needed for wheezing or shortness of breath.   Yes Historical Provider, MD  levothyroxine (SYNTHROID, LEVOTHROID) 112 MCG tablet Take 224 mcg by mouth daily.      Historical Provider, MD    No Known  Allergies  Social History Main Topics  . Smoking status: Never Smoker   . Smokeless tobacco: Never Used     Family History  Problem Relation Age of Onset  . Depression Mother   . Cancer Father     non hodgkins lymphoma  . Mental illness Sister     ocd  . Cancer Brother     neuroblastoma  . Diabetes Maternal Grandmother   . COPD Maternal Grandmother   . Hypertension Maternal Grandmother    Review of Systems As above.    Objective:   Physical Exam Blood pressure 120/86, pulse 91, temperature 98.5 F (36.9 C), temperature source Oral, resp. rate 20, height 5\' 5"  (1.651 m), weight 232 lb (105.235 kg), last menstrual period 03/10/2013, SpO2 97.00%, not currently breastfeeding. Body mass index is 38.61 kg/(m^2). Well-developed, well nourished WF who is awake, alert and oriented, in NAD. During discussion of her anxiety, she becomes tearful. HEENT: [place]/AT, PERRL, EOMI.  Sclera and conjunctiva are clear.  EAC are patent, TMs are normal in appearance. Nasal mucosa is pink and moist. OP is clear. Neck: supple, non-tender, no lymphadenopathy, thyromegaly. Heart: RRR, no murmur Lungs: normal effort, initially with diffuse high-pitched musical wheezing, which resolved completely with albuterol + Atrovent neb. Abdomen: normo-active bowel sounds, supple, non-tender, no mass or organomegaly. Extremities: no cyanosis, clubbing or edema. Skin: warm and dry without rash. Psychologic: good mood and appropriate affect, normal speech  and behavior.      Assessment & Plan:  Wheezing;Asthma- Plan: albuterol (PROVENTIL) (2.5 MG/3ML) 0.083% nebulizer solution 2.5 mg, ipratropium (ATROVENT) nebulizer solution 0.5 mg, albuterol (PROVENTIL HFA;VENTOLIN HFA) 108 (90 BASE) MCG/ACT inhaler; use antihistamine and rescue inhaler to prevent acute exacerbations.  Anxiety - Plan: citalopram (CELEXA) 20 MG tablet; advised psychotherapy.  RTC 6 weeks, sooner if needed.  Fernande Bras, PA-C Physician  Assistant-Certified [facility]

## 2013-03-22 NOTE — Progress Notes (Signed)
Left msg for pt to schedule 6 week f-up with Chelle.

## 2013-03-29 NOTE — Progress Notes (Signed)
Sent reminder letter to pt to schedule f-up with Chelle.

## 2013-06-06 ENCOUNTER — Other Ambulatory Visit: Payer: Self-pay | Admitting: Family Medicine

## 2013-06-06 DIAGNOSIS — E039 Hypothyroidism, unspecified: Secondary | ICD-10-CM

## 2013-06-06 MED ORDER — LEVOTHYROXINE SODIUM 112 MCG PO TABS: 224.0000 ug | ORAL_TABLET | Freq: Every day | ORAL | Status: DC

## 2013-06-06 NOTE — Progress Notes (Signed)
Patient here with son. Out of levothyroxine for 3-4 days, and has not been able to get in touch with endocrinologist with holiday.  Plans on scheduling follow up there.  Last TSH about 6 months ago.  Controlled then. Takes qd. Needs to be on meds so blood work will be accurate.  Will rx 30 days until she can be seen by endocrinologist.

## 2013-07-14 ENCOUNTER — Telehealth: Payer: Self-pay

## 2013-07-14 DIAGNOSIS — E039 Hypothyroidism, unspecified: Secondary | ICD-10-CM

## 2013-07-14 MED ORDER — LEVOTHYROXINE SODIUM 112 MCG PO TABS: 224.0000 ug | ORAL_TABLET | Freq: Every day | ORAL | Status: DC

## 2013-07-14 NOTE — Telephone Encounter (Signed)
Patient would like to get a refill on her synthroid called in to [facility] on [address].   Best#: [phone]

## 2013-08-27 ENCOUNTER — Other Ambulatory Visit: Payer: Self-pay | Admitting: Physician Assistant

## 2013-10-15 ENCOUNTER — Other Ambulatory Visit: Payer: Self-pay | Admitting: *Deleted

## 2013-10-15 ENCOUNTER — Telehealth: Payer: Self-pay | Admitting: *Deleted

## 2013-10-15 ENCOUNTER — Ambulatory Visit (INDEPENDENT_AMBULATORY_CARE_PROVIDER_SITE_OTHER): Payer: Managed Care, Other (non HMO) | Admitting: Emergency Medicine

## 2013-10-15 VITALS — BP 118/70 | HR 96 | Temp 98.2°F | Resp 18 | Ht 64.5 in | Wt 238.6 lb

## 2013-10-15 DIAGNOSIS — S058X9A Other injuries of unspecified eye and orbit, initial encounter: Secondary | ICD-10-CM

## 2013-10-15 DIAGNOSIS — S0502XA Injury of conjunctiva and corneal abrasion without foreign body, left eye, initial encounter: Secondary | ICD-10-CM

## 2013-10-15 DIAGNOSIS — Z0000 Encounter for general adult medical examination without abnormal findings: Secondary | ICD-10-CM

## 2013-10-15 DIAGNOSIS — E039 Hypothyroidism, unspecified: Secondary | ICD-10-CM

## 2013-10-15 LAB — POCT UA - MICROSCOPIC ONLY
Bacteria, U Microscopic: NEGATIVE
Mucus, UA: NEGATIVE

## 2013-10-15 LAB — POCT URINALYSIS DIPSTICK
Bilirubin, UA: NEGATIVE
Glucose, UA: NEGATIVE
Ketones, UA: NEGATIVE
Protein, UA: NEGATIVE
Spec Grav, UA: 1.025

## 2013-10-15 MED ORDER — LEVOTHYROXINE SODIUM 112 MCG PO TABS: 224.0000 ug | ORAL_TABLET | Freq: Every day | ORAL | Status: DC

## 2013-10-15 MED ORDER — CIPROFLOXACIN HCL 0.3 % OP SOLN: 2.0000 [drp] | OPHTHALMIC | Status: DC

## 2013-10-15 MED ORDER — LEVOTHYROXINE SODIUM 112 MCG PO TABS: 112.0000 ug | ORAL_TABLET | Freq: Every day | ORAL | Status: DC

## 2013-10-15 NOTE — Patient Instructions (Signed)

## 2013-10-15 NOTE — Addendum Note (Signed)
Addended by: Carmelina Dane on: 10/15/2013 07:18 PM   Modules accepted: Orders

## 2013-10-15 NOTE — Telephone Encounter (Signed)
Pt called and stated that she takes 2 tablets of levothyroxine daily.  Per Dr. Dareen Piano he wants her to stay on 1 tablet daily until labs come back in.  He also stated that her endocrinologist did not agree that she should be on 2 tabs as well.   LMOM advising about note.

## 2013-10-15 NOTE — Progress Notes (Signed)
[facility] [address], [place] [address] [phone]  Date:  10/15/2013   Name:  Julie Chambers   DOB:  01-14-1981   MRN:  [id number]  PCP:  No primary provider on file.    Chief Complaint: Annual Exam and Conjunctivitis   History of Present Illness:  Julie Chambers is a 32 y.o. very pleasant female patient who presents with the following:  Comes for a comprehensive wellness exam that is required by her employer.  She is off her thyroid replacement for a month and takes no other medication.  Has swollen red eye on her left eye that forced her to take out her contacts yesterday.  Says it is going around her kid's day care.  No improvement with over the counter medications or other home remedies. Denies other complaint or health concern today.   Patient Active Problem List   Diagnosis Date Noted  . Hypothyroidism 03/20/2013  . Asthma 03/20/2013    Past Medical History  Diagnosis Date  . Asthma   . Hypothyroidism   . Ruptured intervertebral disc     lumbar lowest in spine happened in high school and again as young adul no surgery only physical therapy  . History of kidney stones   . Allergy     Past Surgical History  Procedure Laterality Date  . Mole removal    . Lithotripsy      History  Substance Use Topics  . Smoking status: Never Smoker   . Smokeless tobacco: Never Used  . Alcohol Use: No    Family History  Problem Relation Age of Onset  . Depression Mother   . Cancer Father     non hodgkins lymphoma  . Mental illness Sister     ocd  . Cancer Brother     neuroblastoma  . Diabetes Maternal Grandmother   . COPD Maternal Grandmother   . Hypertension Maternal Grandmother   . Anesthesia problems Neg Hx   . Hypotension Neg Hx   . Pseudochol deficiency Neg Hx   . Malignant hyperthermia Neg Hx     No Known Allergies  Medication list has been reviewed and updated.  Current Outpatient Prescriptions on File Prior to Visit   Medication Sig Dispense Refill  . albuterol (PROVENTIL HFA;VENTOLIN HFA) 108 (90 BASE) MCG/ACT inhaler Inhale 2 puffs into the lungs every 4 (four) hours as needed for wheezing or shortness of breath.      Marland Kitchen albuterol (PROVENTIL HFA;VENTOLIN HFA) 108 (90 BASE) MCG/ACT inhaler Inhale 2 puffs into the lungs every 4 (four) hours as needed for wheezing (cough, shortness of breath or wheezing.).  1 Inhaler  1  . citalopram (CELEXA) 20 MG tablet Take 1 tablet (20 mg total) by mouth daily.  30 tablet  3  . levothyroxine (SYNTHROID, LEVOTHROID) 112 MCG tablet Take 1 tablet (112 mcg total) by mouth daily before breakfast. Need office visit and labs for additional refills.  30 tablet  0   No current facility-administered medications on file prior to visit.    Review of Systems:  As per HPI, otherwise negative.    Physical Examination: Filed Vitals:   10/15/13 1728  BP: 118/70  Pulse: 96  Temp: 98.2 F (36.8 C)  Resp: 18   Filed Vitals:   10/15/13 1728  Height: 5' 4.5" (1.638 m)  Weight: 238 lb 9.6 oz (108.228 kg)   Body mass index is 40.34 kg/(m^2). Ideal Body Weight: Weight in (lb) to have  BMI = 25: 147.6  GEN: WDWN, NAD, Non-toxic, A & O x 3 HEENT: Atraumatic, Normocephalic. Neck supple. No masses, No LAD.  Corneal abrasion lower margin left lid Ears and Nose: No external deformity. CV: RRR, No M/G/R. No JVD. No thrill. No extra heart sounds. PULM: CTA B, no wheezes, crackles, rhonchi. No retractions. No resp. distress. No accessory muscle use. ABD: S, NT, ND, +BS. No rebound. No HSM. EXTR: No c/c/e NEURO Normal gait.  PSYCH: Normally interactive. Conversant. Not depressed or anxious appearing.  Calm demeanor.    Assessment and Plan: Obesity Corneal abrasion Wellness exam Hypothyroidism  Resume thyroid Follow up in one month for labs Signed,  Phillips Odor, MD

## 2013-10-15 NOTE — Telephone Encounter (Signed)
Error

## 2013-12-10 ENCOUNTER — Ambulatory Visit: Payer: BC Managed Care – PPO | Admitting: Family Medicine

## 2013-12-10 VITALS — BP 110/80 | HR 84 | Temp 98.4°F | Resp 16 | Ht 64.5 in | Wt 240.0 lb

## 2013-12-10 DIAGNOSIS — J029 Acute pharyngitis, unspecified: Secondary | ICD-10-CM

## 2013-12-10 DIAGNOSIS — F329 Major depressive disorder, single episode, unspecified: Secondary | ICD-10-CM

## 2013-12-10 DIAGNOSIS — F419 Anxiety disorder, unspecified: Secondary | ICD-10-CM

## 2013-12-10 DIAGNOSIS — Z8709 Personal history of other diseases of the respiratory system: Secondary | ICD-10-CM

## 2013-12-10 DIAGNOSIS — F341 Dysthymic disorder: Secondary | ICD-10-CM

## 2013-12-10 DIAGNOSIS — J019 Acute sinusitis, unspecified: Secondary | ICD-10-CM

## 2013-12-10 DIAGNOSIS — E039 Hypothyroidism, unspecified: Secondary | ICD-10-CM

## 2013-12-10 LAB — COMPREHENSIVE METABOLIC PANEL
ALT: 21 U/L (ref 0–35)
AST: 22 U/L (ref 0–37)
Albumin: 3.9 g/dL (ref 3.5–5.2)
Alkaline Phosphatase: 60 U/L (ref 39–117)
BUN: 13 mg/dL (ref 6–23)
CALCIUM: 9.5 mg/dL (ref 8.4–10.5)
CHLORIDE: 102 meq/L (ref 96–112)
CO2: 25 mEq/L (ref 19–32)
Creat: 0.62 mg/dL (ref 0.50–1.10)
Glucose, Bld: 77 mg/dL (ref 70–99)
Potassium: 4.4 mEq/L (ref 3.5–5.3)
Sodium: 137 mEq/L (ref 135–145)
Total Bilirubin: 0.3 mg/dL (ref 0.3–1.2)
Total Protein: 7.2 g/dL (ref 6.0–8.3)

## 2013-12-10 LAB — POCT CBC
Granulocyte percent: 65.5 %G (ref 37–80)
HEMATOCRIT: 42.6 % (ref 37.7–47.9)
Hemoglobin: 13.6 g/dL (ref 12.2–16.2)
Lymph, poc: 2.9 (ref 0.6–3.4)
MCH: 28.5 pg (ref 27–31.2)
MCHC: 31.9 g/dL (ref 31.8–35.4)
MCV: 89.1 fL (ref 80–97)
MID (CBC): 0.5 (ref 0–0.9)
MPV: 9.3 fL (ref 0–99.8)
POC Granulocyte: 6.5 (ref 2–6.9)
POC LYMPH PERCENT: 29.4 %L (ref 10–50)
POC MID %: 5.1 %M (ref 0–12)
Platelet Count, POC: 248 10*3/uL (ref 142–424)
RBC: 4.78 M/uL (ref 4.04–5.48)
RDW, POC: 13.6 %
WBC: 10 10*3/uL (ref 4.6–10.2)

## 2013-12-10 LAB — POCT RAPID STREP A (OFFICE): RAPID STREP A SCREEN: NEGATIVE

## 2013-12-10 LAB — LIPID PANEL
CHOL/HDL RATIO: 3.5 ratio
Cholesterol: 188 mg/dL (ref 0–200)
HDL: 54 mg/dL (ref >39)
LDL CALC: 108 mg/dL — AB (ref 0–99)
Triglycerides: 132 mg/dL (ref <150)
VLDL: 26 mg/dL (ref 0–40)

## 2013-12-10 LAB — TSH: TSH: 56.522 u[IU]/mL — AB (ref 0.350–4.500)

## 2013-12-10 MED ORDER — AMOXICILLIN 875 MG PO TABS: 875.0000 mg | ORAL_TABLET | Freq: Two times a day (BID) | ORAL | Status: AC

## 2013-12-10 MED ORDER — LEVOTHYROXINE SODIUM 112 MCG PO TABS: 224.0000 ug | ORAL_TABLET | Freq: Every day | ORAL | Status: DC

## 2013-12-10 MED ORDER — ALBUTEROL SULFATE HFA 108 (90 BASE) MCG/ACT IN AERS: 2.0000 | INHALATION_SPRAY | RESPIRATORY_TRACT | Status: DC | PRN

## 2013-12-10 MED ORDER — CITALOPRAM HYDROBROMIDE 20 MG PO TABS: 20.0000 mg | ORAL_TABLET | Freq: Every day | ORAL | Status: DC

## 2013-12-10 NOTE — Patient Instructions (Addendum)
Increase the Synthroid from 1 pill to 2 pills after one week of being back on it. Return in 2 months for a recheck, sometime early March.  It will take a couple of weeks for the citalopram to the working on your anxiety and depression. In the event of acutely worsening depression at anytime, or if any suicidal thoughts, return promptly.  I will go ahead and begin you on antibiotics pending the throat culture.

## 2013-12-10 NOTE — Progress Notes (Signed)
Subjective: 33 year old 56lady who is here with a sore throat that started yesterday. Her daughter had the flu and she got a little illness after that, with some cough and congestion over the past couple weeks. That seems to have subsided a good deal, but yesterday she started having bad sore throat. She did not have any documented fever. Not having much in the way of runny nose and cough now.  She also was placed about 9 months ago on a course of citalopram for anxiety and depression. It helped, but she never came back in for a followup and continuation of medication. She thinks she would like on it.  She needs her thyroid reviewed. Blood work did not get done last time, so we will get that done today but continue on the same medication in the meanwhile. She used to go to an endocrinologist but has trouble getting in there.  Objective: Pleasant lady who is emotionally labile. When we discussed her anxiety and depression she cries easily. She does not feel like it appears her work, but does interfere with things at home some.  Her TMs are normal. Throat erythematous without exudate. She has very large anterior cervical nodes. Chest clear. Heart regular without murmurs.  Assessment: Pharyngitis Anxiety and depression Hypothyroidism  Plan: Strep test Continue her previous medications. Will get her blood work that was ordered for sometime in the future today for she is here.  Discussed the patient's hypothyroid with her. She apparently has had hypothyroidism for a long time. She says she is controlled on 2 of the 0.112 pills daily which is a daily dose of 224 mcg, fairly high dose. She says she's had trouble getting primary care doctors to prescribe it does. However central getting in with the endocrinologist. She's been out of medications for about a week. Advised her that today his TSH may not be totally accurate. We need to get a back on original dose, but I would like to take a lower dose for one  week, only one pill daily, then go back to daily. Repeat labs one more time in about 2 months to make certain that her thyroid is properly controlled with TSH in the appropriate range.  Will continue her antianxiety antidepression medications. She has no suicidal thoughts.  Results for orders placed in visit on 12/10/13  POCT RAPID STREP A (OFFICE)      Result Value Range   Rapid Strep A Screen Negative  Negative  POCT CBC      Result Value Range   WBC 10.0  4.6 - 10.2 K/uL   Lymph, poc 2.9  0.6 - 3.4   POC LYMPH PERCENT 29.4  10 - 50 %L   MID (cbc) 0.5  0 - 0.9   POC MID % 5.1  0 - 12 %M   POC Granulocyte 6.5  2 - 6.9   Granulocyte percent 65.5  37 - 80 %G   RBC 4.78  4.04 - 5.48 M/uL   Hemoglobin 13.6  12.2 - 16.2 g/dL   HCT, POC 16.142.6  09.637.7 - 47.9 %   MCV 89.1  80 - 97 fL   MCH, POC 28.5  27 - 31.2 pg   MCHC 31.9  31.8 - 35.4 g/dL   RDW, POC 04.513.6     Platelet Count, POC 248  142 - 424 K/uL   MPV 9.3  0 - 99.8 fL

## 2013-12-11 ENCOUNTER — Other Ambulatory Visit: Payer: Self-pay | Admitting: Emergency Medicine

## 2013-12-11 DIAGNOSIS — E039 Hypothyroidism, unspecified: Secondary | ICD-10-CM

## 2013-12-12 LAB — CULTURE, GROUP A STREP: Organism ID, Bacteria: NORMAL

## 2014-08-29 ENCOUNTER — Ambulatory Visit (INDEPENDENT_AMBULATORY_CARE_PROVIDER_SITE_OTHER): Payer: BC Managed Care – PPO | Admitting: Emergency Medicine

## 2014-08-29 ENCOUNTER — Other Ambulatory Visit: Payer: Self-pay | Admitting: Emergency Medicine

## 2014-08-29 ENCOUNTER — Encounter: Payer: Self-pay | Admitting: Emergency Medicine

## 2014-08-29 ENCOUNTER — Encounter (HOSPITAL_COMMUNITY): Payer: Self-pay | Admitting: Emergency Medicine

## 2014-08-29 ENCOUNTER — Emergency Department (HOSPITAL_COMMUNITY)
Admission: EM | Admit: 2014-08-29 | Discharge: 2014-08-29 | Disposition: A | Discharge status: Home / Self Care | Payer: BC Managed Care – PPO | Attending: Emergency Medicine | Admitting: Emergency Medicine

## 2014-08-29 VITALS — BP 94/76 | HR 91 | Temp 97.8°F | Resp 20

## 2014-08-29 DIAGNOSIS — Z87442 Personal history of urinary calculi: Secondary | ICD-10-CM | POA: Insufficient documentation

## 2014-08-29 DIAGNOSIS — J45901 Unspecified asthma with (acute) exacerbation: Secondary | ICD-10-CM | POA: Diagnosis not present

## 2014-08-29 DIAGNOSIS — E039 Hypothyroidism, unspecified: Secondary | ICD-10-CM

## 2014-08-29 DIAGNOSIS — J45909 Unspecified asthma, uncomplicated: Secondary | ICD-10-CM

## 2014-08-29 DIAGNOSIS — I959 Hypotension, unspecified: Secondary | ICD-10-CM | POA: Diagnosis not present

## 2014-08-29 DIAGNOSIS — R0602 Shortness of breath: Secondary | ICD-10-CM | POA: Diagnosis present

## 2014-08-29 DIAGNOSIS — R079 Chest pain, unspecified: Secondary | ICD-10-CM | POA: Diagnosis not present

## 2014-08-29 DIAGNOSIS — Z79899 Other long term (current) drug therapy: Secondary | ICD-10-CM | POA: Diagnosis not present

## 2014-08-29 DIAGNOSIS — Z87828 Personal history of other (healed) physical injury and trauma: Secondary | ICD-10-CM | POA: Diagnosis not present

## 2014-08-29 LAB — POCT CBC
Granulocyte percent: 67.1 %G (ref 37–80)
HCT, POC: 42 % (ref 37.7–47.9)
Hemoglobin: 13.9 g/dL (ref 12.2–16.2)
LYMPH, POC: 2.5 (ref 0.6–3.4)
MCH: 27.8 pg (ref 27–31.2)
MCHC: 33 g/dL (ref 31.8–35.4)
MCV: 84.1 fL (ref 80–97)
MID (CBC): 0.4 (ref 0–0.9)
MPV: 7.7 fL (ref 0–99.8)
PLATELET COUNT, POC: 235 10*3/uL (ref 142–424)
POC Granulocyte: 6 (ref 2–6.9)
POC LYMPH %: 28.2 % (ref 10–50)
POC MID %: 4.7 % (ref 0–12)
RBC: 4.99 M/uL (ref 4.04–5.48)
RDW, POC: 13.7 %
WBC: 8.9 10*3/uL (ref 4.6–10.2)

## 2014-08-29 LAB — BASIC METABOLIC PANEL WITH GFR
Anion gap: 11 (ref 5–15)
BUN: 15 mg/dL (ref 6–23)
CO2: 26 meq/L (ref 19–32)
Calcium: 8.9 mg/dL (ref 8.4–10.5)
Chloride: 104 meq/L (ref 96–112)
Creatinine, Ser: 0.75 mg/dL (ref 0.50–1.10)
GFR calc Af Amer: >90 mL/min (ref >90)
GFR calc non Af Amer: >90 mL/min (ref >90)
Glucose, Bld: 89 mg/dL (ref 70–99)
Potassium: 4.3 meq/L (ref 3.7–5.3)
Sodium: 141 meq/L (ref 137–147)

## 2014-08-29 LAB — CBC
HCT: 35.3 % — ABNORMAL LOW (ref 36.0–46.0)
Hemoglobin: 11.9 g/dL — ABNORMAL LOW (ref 12.0–15.0)
MCH: 28.7 pg (ref 26.0–34.0)
MCHC: 33.7 g/dL (ref 30.0–36.0)
MCV: 85.3 fL (ref 78.0–100.0)
Platelets: 193 K/uL (ref 150–400)
RBC: 4.14 MIL/uL (ref 3.87–5.11)
RDW: 13.4 % (ref 11.5–15.5)
WBC: 7.7 K/uL (ref 4.0–10.5)

## 2014-08-29 LAB — D-DIMER, QUANTITATIVE (NOT AT ARMC)

## 2014-08-29 LAB — TSH: TSH: 40.805 u[IU]/mL — ABNORMAL HIGH (ref 0.350–4.500)

## 2014-08-29 LAB — POC URINE PREG, ED: Preg Test, Ur: NEGATIVE

## 2014-08-29 MED ORDER — ALBUTEROL SULFATE (2.5 MG/3ML) 0.083% IN NEBU: 5.0000 mg | INHALATION_SOLUTION | Freq: Once | RESPIRATORY_TRACT | Status: DC

## 2014-08-29 MED ORDER — IPRATROPIUM BROMIDE 0.02 % IN SOLN
0.5000 mg | Freq: Once | RESPIRATORY_TRACT | Status: AC
  Administered 2014-08-29: 0.5 mg via RESPIRATORY_TRACT

## 2014-08-29 MED ORDER — ALBUTEROL SULFATE (2.5 MG/3ML) 0.083% IN NEBU
2.5000 mg | INHALATION_SOLUTION | Freq: Once | RESPIRATORY_TRACT | Status: AC
  Administered 2014-08-29: 2.5 mg via RESPIRATORY_TRACT

## 2014-08-29 MED ORDER — SODIUM CHLORIDE 0.9 % IV BOLUS (SEPSIS)
1000.0000 mL | Freq: Once | INTRAVENOUS | Status: AC
  Administered 2014-08-29: 1000 mL via INTRAVENOUS

## 2014-08-29 MED ORDER — PREDNISONE 20 MG PO TABS
60.0000 mg | ORAL_TABLET | Freq: Once | ORAL | Status: AC
  Administered 2014-08-29: 60 mg via ORAL
  Filled 2014-08-29: qty 3

## 2014-08-29 MED ORDER — ALBUTEROL SULFATE HFA 108 (90 BASE) MCG/ACT IN AERS
2.0000 | INHALATION_SPRAY | RESPIRATORY_TRACT | Status: DC | PRN
  Administered 2014-08-29: 2 via RESPIRATORY_TRACT
  Filled 2014-08-29: qty 6.7

## 2014-08-29 NOTE — ED Notes (Signed)
Bed: ZO10 Expected date:  Expected time:  Means of arrival:  Comments: EMS- SOB, Hypotension

## 2014-08-29 NOTE — ED Notes (Signed)
Per EMS pt received albuterol and atrovent at doctors office with 1000 ml of normal saline. Per pt symptoms of shortness of breath, chest tightness, and difficulty breathing started Saturday night. Pt used to have an inhaler but does not anymore.

## 2014-08-29 NOTE — Progress Notes (Signed)
  CARE MANAGEMENT ED NOTE 08/29/2014  Patient:  Julie Chambers, Julie Chambers   Account Number:  [id number]  Date Initiated:  08/29/2014  Documentation initiated by:  Edd Arbour  Subjective/Objective Assessment:   33 yr old bcbs [place] pt received albuterol and atrovent at doctors office with 1000 ml of normal saline. Per pt symptoms of shortness of breath, chest tightness, and difficulty breathing started Saturday night.     Subjective/Objective Assessment Detail:   Pt used to have an inhaler but does not anymore.  [facility] is who pt sees at this time     Action/Plan:   SPoke with pt see notes below   Action/Plan Detail:   Anticipated DC Date:  08/29/2014     Status Recommendation to Physician:   Result of Recommendation:    Other ED Services  Consult Working Plan    DC Planning Services  Other  Outpatient Services - Pt will follow up  PCP issues    Choice offered to / List presented to:            Status of service:  Completed, signed off  ED Comments:   ED Comments Detail:  [facility] ED CM spoke with pt on how to obtain an in network pcp with insurance coverage via the customer service number or web site Cm reviewed ED level of care for crisis/emergent services and community pcp level of care to manage continuous or chronic medical concerns.  The pt voiced understanding CM encouraged pt and discussed pt's responsibility to verify with pt's insurance carrier that any recommended medical provider offered by any emergency room or a hospital provider is within the carrier's network. The pt voiced understanding

## 2014-08-29 NOTE — ED Provider Notes (Signed)
CSN: [id number]     Arrival date & time 08/29/14  1037 History   First MD Initiated Contact with Patient 08/29/14 1046     Chief Complaint  Patient presents with  . Shortness of Breath     (Consider location/radiation/quality/duration/timing/severity/associated sxs/prior Treatment) HPI  Patient to the ER with complaints of SOB. [facility] for hypotension, CP, SOB r/o PE. She was at her parents house this weekend and they have a dog that she is allergic too, it was then that her wheezing and SOB started . She reports not eating as much or drinking as much for no particular reason. Urgent Care gave her breathing treatment which helped her wheezing to resolve but she had some mild SOB and CP still. With that and the hypotension he wanted r/o PE. She was given 1L normal saline in route. BP improved from 84/60 to 107/75.   Patient is telling me that between the time the ambulance came and arriving to the ED her symptoms have all resolved. She no longer feels SOB, pain in her chst or wheezing. Denies lE swelling. Denies BC use. Was in car about 7 hours this weekend.  Past Medical History  Diagnosis Date  . Asthma   . Hypothyroidism   . Ruptured intervertebral disc     lumbar lowest in spine happened in high school and again as young adul no surgery only physical therapy  . History of kidney stones   . Allergy    Past Surgical History  Procedure Laterality Date  . Mole removal    . Lithotripsy     Family History  Problem Relation Age of Onset  . Depression Mother   . Cancer Father     non hodgkins lymphoma  . Mental illness Sister     ocd  . Cancer Brother     neuroblastoma  . Diabetes Maternal Grandmother   . COPD Maternal Grandmother   . Hypertension Maternal Grandmother   . Anesthesia problems Neg Hx   . Hypotension Neg Hx   . Pseudochol deficiency Neg Hx   . Malignant hyperthermia Neg Hx    History  Substance Use Topics  . Smoking  status: Never Smoker   . Smokeless tobacco: Never Used  . Alcohol Use: Yes     Comment: occasionally    OB History   Grav Para Term Preterm Abortions TAB SAB Ect Mult Living   0 1   2     Review of Systems  All other systems reviewed and are negative.     Allergies  Review of patient's allergies indicates no known allergies.  Home Medications   Prior to Admission medications   Medication Sig Start Date End Date Taking? Authorizing Provider  albuterol (PROVENTIL HFA;VENTOLIN HFA) 108 (90 BASE) MCG/ACT inhaler Inhale 2 puffs into the lungs every 6 (six) hours as needed for wheezing or shortness of breath.   Yes Historical Provider, MD  citalopram (CELEXA) 20 MG tablet Take 20 mg by mouth daily.   Yes Historical Provider, MD  levothyroxine (SYNTHROID, LEVOTHROID) 112 MCG tablet Take 112 mcg by mouth daily before breakfast.   Yes Historical Provider, MD   BP 146/83  Pulse 73  Temp(Src) 98.1 F (36.7 C) (Oral)  Resp 16  SpO2 99%  LMP 08/18/2014 Physical Exam  Nursing note and vitals reviewed. Constitutional: She appears well-developed and well-nourished. No distress.  HENT:  Head: Normocephalic and atraumatic.  Eyes: Pupils  are equal, round, and reactive to light.  Neck: Normal range of motion. Neck supple.  Cardiovascular: Normal rate and regular rhythm.   Pulmonary/Chest: Effort normal. No respiratory distress. She has no wheezes. She has no rales. She exhibits no tenderness.  Abdominal: Soft.  Musculoskeletal:  No LE swelling bilaterally  Neurological: She is alert.  Skin: Skin is warm and dry.    ED Course  Procedures (including critical care time) Labs Review Labs Reviewed  CBC - Abnormal; Notable for the following:    Hemoglobin 11.9 (*)    HCT 35.3 (*)    All other components within normal limits  BASIC METABOLIC PANEL  D-DIMER, QUANTITATIVE  POC URINE PREG, ED    Imaging Review No results found.   EKG Interpretation None     JAMARA, VARY [id number] 29-Aug-2014 10:42:29 [facility] ED ROUTINE RECORD Sinus rhythm Baseline wander in lead(s) III V1 54mm/s 64mm/mV  8.0 SP2 CID: [id number] Referred by: Unconfirmed Vent. rate 73 BPM PR interval 150 ms QRS duration 83 ms QT/QTc 413/455 ms P-R-T axes 9 81 50 09-Apr-1981 (33 yr) Female Caucasian Room:WLEX1 Loc:501 Technician: [id number]  MDM   Final diagnoses:  Allergic asthma, unspecified asthma severity, uncomplicated    Pt has no symptoms in the ED. Medications worked in route. Did not receive steroids and is out of inhaler. Given  Medications  sodium chloride 0.9 % bolus 1,000 mL (1,000 mLs Intravenous New Bag/Given 08/29/14 1150)  predniSONE (DELTASONE) tablet 60 mg (not administered)  albuterol (PROVENTIL HFA;VENTOLIN HFA) 108 (90 BASE) MCG/ACT inhaler 2 puff (not administered)    Fortunately after her second liter of fluids BP improved to 146/83. Negative d-dimer. Normal CBC, BMP, and urine preg. Patient safe for dc at this time. Cont to be asymptomatic.  Rx Prednisone and albuterol inhalers.  33 y.o.Leesa Slawson's evaluation in the Emergency Department is complete. It has been determined that no acute conditions requiring further emergency intervention are present at this time. The patient/guardian have been advised of the diagnosis and plan. We have discussed signs and symptoms that warrant return to the ED, such as changes or worsening in symptoms.  Vital signs are stable at discharge. Filed Vitals:   08/29/14 1150  BP: 146/83  Pulse: 73  Temp:   Resp: 16    Patient/guardian has voiced understanding and agreed to follow-up with the PCP or specialist.     Dorthula Matas, PA-C 08/29/14 1232

## 2014-08-29 NOTE — ED Provider Notes (Signed)
Medical screening examination/treatment/procedure(s) were performed by non-physician practitioner and as supervising physician I was immediately available for consultation/collaboration.   EKG Interpretation   Date/Time:  Monday August 29 2014 10:42:29 EDT Ventricular Rate:  73 PR Interval:  150 QRS Duration: 83 QT Interval:  413 QTC Calculation: 455 R Axis:   81 Text Interpretation:  Sinus rhythm Baseline wander in lead(s) III V1  Confirmed by Rubin Payor  MD, Enrico Eaddy [phone]) on 08/29/2014 3:34:52 PM       Juliet Rude. Rubin Payor, MD 08/29/14 [phone]

## 2014-08-29 NOTE — Progress Notes (Signed)
   Subjective:    Patient ID: Julie Chambers, female    DOB: 13-Sep-1981, 33 y.o.   MRN: [id number] This chart was scribed for Julie Chris, MD by Littie Deeds, ED Scribe. This patient was seen in room Room/bed info 6 and the patient's care was started at 8:35 AM.   HPI HPI Comments: Julie Chambers is a 33 y.o. female who presents to the Emergency Department complaining of gradual onset, constant SOB that began 3 days ago. Pt states that she had to spend the weekend with her parents. They have dogs which she is allergic to. She started having difficulty with her breathing and used her mom's inhaler. Despite this, she has had persistent SOB and chest tightness. She spent the night at her parent's house last night. She continues to be SOB this morning with wheezing. She has had a productive cough of clear phlegm.    Review of Systems See HPI. A complete 10 system review of systems was obtained and all systems are negative except as noted in the HPI and PMH.      Objective:   Physical Exam  CONSTITUTIONAL: Well developed/well nourished HEAD: Normocephalic/atraumatic EYES: EOM/PERRL ENMT: Mucous membranes moist NECK: supple no meningeal signs SPINE: entire spine nontender CV: S1/S2 noted, no murmurs/rubs/gallops noted LUNGS: Lungs are clear to auscultation bilaterally, no apparent distress, bilateral wheezes with symmetrical air exchange ABDOMEN: soft, nontender, no rebound or guarding GU: no cva tenderness NEURO: Pt is awake/alert, moves all extremitiesx4 EXTREMITIES: pulses normal, full ROM SKIN: warm, color normal PSYCH: no abnormalities of mood noted       Assessment & Plan:  Patient did improve after a nebulizer treatment however she does still feel some shortness of breath and chest tightness. She has been doing a lot of traveling. This raises the question of a PE regarding her hypootension. Would consider d-dimer for evaluation. IV fluids started she will be sent to the hospital  for evaluation . I personally performed the services described in this documentation, which was scribed in my presence. The recorded information has been reviewed and is accurate.

## 2014-08-29 NOTE — ED Notes (Signed)
Per pt. "when I get up to walk around I have to stop and hold onto something because I cannot catch my breath."

## 2014-08-31 LAB — T4, FREE: FREE T4: 0.77 ng/dL — AB (ref 0.80–1.80)

## 2014-09-03 ENCOUNTER — Other Ambulatory Visit: Payer: Self-pay | Admitting: Emergency Medicine

## 2014-09-03 MED ORDER — LEVOTHYROXINE SODIUM 112 MCG PO TABS: 112.0000 ug | ORAL_TABLET | Freq: Every day | ORAL | Status: DC

## 2014-09-03 MED ORDER — ALBUTEROL SULFATE HFA 108 (90 BASE) MCG/ACT IN AERS
2.0000 | INHALATION_SPRAY | Freq: Four times a day (QID) | RESPIRATORY_TRACT | Status: DC | PRN
Start: 2014-09-03 — End: 2015-10-28

## 2014-10-03 ENCOUNTER — Encounter (HOSPITAL_COMMUNITY): Payer: Self-pay | Admitting: Emergency Medicine

## 2015-01-01 ENCOUNTER — Telehealth: Payer: Self-pay

## 2015-01-01 NOTE — Telephone Encounter (Signed)
PATIENT IS REQUESTING A REFILL ON LEVOTHYROXINE 112 MCG FOR HER HYPOTHYROIDISM. SHE SAID THE PHARMACY WOULD NOT LET HER CALL IT IN. SHE SAID SHE HAS SEEN SEVERAL DOCTORS. BEST PHONE [phone] (CELL)  PHARMACY CHOICE IS [facility] ON [address].  MBC

## 2015-01-02 NOTE — Telephone Encounter (Signed)
Last OV 08/27/2015. Pt was supposed to follow up in 6-8 weeks. She states she has been out of her medication for 2 weeks and feels if she comes in for bloodwork, it will be inaccurate. She would like another 30 day supply and will come in to recheck levels.Please advise.

## 2015-01-05 ENCOUNTER — Other Ambulatory Visit: Payer: Self-pay | Admitting: Emergency Medicine

## 2015-01-05 NOTE — Telephone Encounter (Signed)
See notes on 01/01/15 phone message

## 2015-01-05 NOTE — Telephone Encounter (Signed)
RF req came in from pharm and I discovered this message that had been sent to PA pool. I do not see that it has been addressed by PAs but do not see it in their in-baskets either. I RFd x 1 mos per protocol and LMOM for pt advising this and to make sure she takes it as directed and RTC bf it runs out.

## 2015-03-29 ENCOUNTER — Ambulatory Visit (INDEPENDENT_AMBULATORY_CARE_PROVIDER_SITE_OTHER): Payer: BLUE CROSS/BLUE SHIELD | Admitting: Family Medicine

## 2015-03-29 ENCOUNTER — Encounter (INDEPENDENT_AMBULATORY_CARE_PROVIDER_SITE_OTHER): Payer: Self-pay

## 2015-03-29 VITALS — BP 110/78 | HR 88 | Temp 98.2°F | Ht 64.75 in | Wt 247.0 lb

## 2015-03-29 DIAGNOSIS — F418 Other specified anxiety disorders: Secondary | ICD-10-CM | POA: Diagnosis not present

## 2015-03-29 DIAGNOSIS — F32A Depression, unspecified: Secondary | ICD-10-CM

## 2015-03-29 DIAGNOSIS — E039 Hypothyroidism, unspecified: Secondary | ICD-10-CM

## 2015-03-29 DIAGNOSIS — F419 Anxiety disorder, unspecified: Secondary | ICD-10-CM

## 2015-03-29 DIAGNOSIS — F329 Major depressive disorder, single episode, unspecified: Secondary | ICD-10-CM

## 2015-03-29 MED ORDER — CITALOPRAM HYDROBROMIDE 20 MG PO TABS: 20.0000 mg | ORAL_TABLET | Freq: Every day | ORAL | Status: DC

## 2015-03-29 NOTE — Progress Notes (Signed)
 Chief Complaint:  Chief Complaint  Patient presents with  . Medication Refill    Completely out of Celexa & Levothyroxine-Needs labwork also    HPI: Allesha Aronoff is a 34 y.o. female who is here for medication refills : Mrs. Hermelinda Medicus is here for a hypothyroid recheck and also a reinitiation of her Celexa for anxiety. She was previously seen by Dr. Lucianne Muss and was on 224 cg of levothyroxine. However 7 months ago when we saw her she was only on 112 g of levothyroxine. Her TSH at the time was 40.8. She states that she was on an underdosing of the levothyroxine from our office. She would like to see Dr. Lucianne Muss again but he has changed practices and is with the power and because of her work schedule can't get into see him as she would like and he is book so far out. Recently She ran out of her medications and has been taking some medicine of levothyroxine that her mother had left over from an oral prescription. She has been taking 75 cg 2 pills daily.She is taking the medication as prescribed, in the mornings on an empty stomach with water and no other medications.. She was diagnosed with hypothyroidism at age 44. She has had hypothyroid symptoms of weight gain, dry skin dry hair. She has more anxiety than depression. She feels that Celexa actually helps her control this better. She has been without it for a few weeks. She denies any withdrawal symptoms. She denies any suicidal, homicidal thoughts. No hallucinations.  Lab Results  Component Value Date   TSH 40.805* 08/29/2014     Wt Readings from Last 3 Encounters:  03/29/15 247 lb (112.038 kg)  12/10/13 240 lb (108.863 kg)  10/15/13 238 lb 9.6 oz (108.228 kg)      Past Medical History  Diagnosis Date  . Asthma   . Hypothyroidism   . Ruptured intervertebral disc     lumbar lowest in spine happened in high school and again as young adul no surgery only physical therapy  . History of kidney stones   . Allergy    Past Surgical  History  Procedure Laterality Date  . Mole removal    . Lithotripsy     History   Social History  . Marital Status: Married    Spouse Name: N/A  . Number of Children: 2  . Years of Education: N/A   Social History Main Topics  . Smoking status: Never Smoker   . Smokeless tobacco: Never Used  . Alcohol Use: 0.0 oz/week    0 Standard drinks or equivalent per week     Comment: occasionally   . Drug Use: No  . Sexual Activity:    Partners: Male    Birth Control/ Protection: Condom   Other Topics Concern  . None   Social History Narrative   Family History  Problem Relation Age of Onset  . Depression Mother   . Cancer Father     non hodgkins lymphoma  . Mental illness Sister     ocd  . Cancer Brother     neuroblastoma  . Diabetes Maternal Grandmother   . COPD Maternal Grandmother   . Hypertension Maternal Grandmother   . Anesthesia problems Neg Hx   . Hypotension Neg Hx   . Pseudochol deficiency Neg Hx   . Malignant hyperthermia Neg Hx    No Known Allergies Prior to Admission medications   Medication Sig Start Date End Date Taking? Authorizing  Provider  albuterol (PROVENTIL HFA;VENTOLIN HFA) 108 (90 BASE) MCG/ACT inhaler Inhale 2 puffs into the lungs every 6 (six) hours as needed for wheezing or shortness of breath. 09/03/14  Yes Collene GobbleSteven A Daub, MD  citalopram (CELEXA) 20 MG tablet Take 20 mg by mouth daily.   Yes Historical Provider, MD  levothyroxine (SYNTHROID, LEVOTHROID) 112 MCG tablet Take 1 tablet (112 mcg total) by mouth daily with breakfast. PATIENT NEEDS OFFICE VISIT/LABS FOR ADDITIONAL REFILLS 01/05/15  Yes Collene GobbleSteven A Daub, MD     ROS: The patient denies fevers, chills, night sweats, unintentional weight loss, chest pain, palpitations, wheezing, dyspnea on exertion, nausea, vomiting, abdominal pain, dysuria, hematuria, melena, numbness, weakness, or tingling.   All other systems have been reviewed and were otherwise negative with the exception of those mentioned  in the HPI and as above.    PHYSICAL EXAM: Filed Vitals:   03/29/15 1752  BP: 110/78  Pulse: 88  Temp: 98.2 F (36.8 C)   Filed Vitals:   03/29/15 1752  Height: 5' 4.75" (1.645 m)  Weight: 247 lb (112.038 kg)   Body mass index is 41.4 kg/(m^2).  General: Alert, no acute distress HEENT:  Normocephalic, atraumatic, oropharynx patent. EOMI, PERRLA. No appreciable thyroid megaly Cardiovascular:  Regular rate and rhythm, no rubs murmurs or gallops.  Radial pulse intact. No pedal edema.  Respiratory: Clear to auscultation bilaterally.  No wheezes, rales, or rhonchi.  No cyanosis, no use of accessory musculature GI: No organomegaly, abdomen is soft and non-tender, positive bowel sounds.  No masses. Skin: No rashes. Neurologic: Facial musculature symmetric. Psychiatric: Patient is appropriate throughout our interaction. Lymphatic: No cervical lymphadenopathy Musculoskeletal: Gait intact.   LABS: Results for orders placed or performed during the hospital encounter of 08/29/14  CBC  Result Value Ref Range   WBC 7.7 4.0 - 10.5 K/uL   RBC 4.14 3.87 - 5.11 MIL/uL   Hemoglobin 11.9 (L) 12.0 - 15.0 g/dL   HCT 62.135.3 (L) 30.836.0 - 65.746.0 %   MCV 85.3 78.0 - 100.0 fL   MCH 28.7 26.0 - 34.0 pg   MCHC 33.7 30.0 - 36.0 g/dL   RDW 84.613.4 96.211.5 - 95.215.5 %   Platelets 193 150 - 400 K/uL  Basic metabolic panel  Result Value Ref Range   Sodium 141 137 - 147 mEq/L   Potassium 4.3 3.7 - 5.3 mEq/L   Chloride 104 96 - 112 mEq/L   CO2 26 19 - 32 mEq/L   Glucose, Bld 89 70 - 99 mg/dL   BUN 15 6 - 23 mg/dL   Creatinine, Ser 8.410.75 0.50 - 1.10 mg/dL   Calcium 8.9 8.4 - 32.410.5 mg/dL   GFR calc non Af Amer >90 >90 mL/min   GFR calc Af Amer >90 >90 mL/min   Anion gap 11 5 - 15  D-dimer, quantitative  Result Value Ref Range   D-Dimer, Quant <0.27 0.00 - 0.48 ug/mL-FEU  POC urine preg, ED (not at [facility])  Result Value Ref Range   Preg Test, Ur NEGATIVE NEGATIVE     EKG/XRAY:   Primary read interpreted [facility]  [facility].   ASSESSMENT/PLAN: Encounter Diagnoses  Name Primary?  . Hypothyroidism, unspecified hypothyroidism type Yes  . Anxiety and depression    Mrs. Kathalene FramesJeannie Schwartz is a very pleasant Caucasian female with him past medical history of hypothyroidism diagnosed at age 34, anxiety and depression. She has had issues with getting her TSH level to be within normal range since she does  not follow a very regularly. She was previously seen by Dr. Lucianne Muss, an endocrinologist was giving her 33 cg of levothyroxine. Today we will go ahead and get her TSH and free T4 T3 levels. Based on those lab results we will adjust accordingly. She understands that I will titrate her up accordingly as long as she is compliant with follow-up. Prescribed Celexa Follow-up with labs otherwise in 6 months.  Gross sideeffects, risk and benefits, and alternatives of medications d/w patient. Patient is aware that all medications have potential sideeffects and we are unable to predict every sideeffect or drug-drug interaction that may occur.  ,  PHUONG, DO 03/30/2015 6:59 AM

## 2015-03-30 ENCOUNTER — Other Ambulatory Visit: Payer: Self-pay | Admitting: Family Medicine

## 2015-03-30 ENCOUNTER — Telehealth: Payer: Self-pay | Admitting: Family Medicine

## 2015-03-30 DIAGNOSIS — E039 Hypothyroidism, unspecified: Secondary | ICD-10-CM

## 2015-03-30 LAB — TSH: TSH: 69.992 u[IU]/mL — ABNORMAL HIGH (ref 0.350–4.500)

## 2015-03-30 LAB — T4, FREE: Free T4: 0.6 ng/dL — ABNORMAL LOW (ref 0.80–1.80)

## 2015-03-30 LAB — T3, FREE: T3, Free: 1.9 pg/mL — ABNORMAL LOW (ref 2.3–4.2)

## 2015-03-30 MED ORDER — LEVOTHYROXINE SODIUM 175 MCG PO TABS: 175.0000 ug | ORAL_TABLET | Freq: Every day | ORAL | Status: DC

## 2015-03-30 NOTE — Telephone Encounter (Signed)
Spoke re labs. Will take levothyroxine 175 mcg daily, will return in 6 weeks to get thyroid labs only rechecked.

## 2015-05-25 ENCOUNTER — Encounter (HOSPITAL_COMMUNITY): Payer: Self-pay | Admitting: Emergency Medicine

## 2015-05-25 ENCOUNTER — Emergency Department (HOSPITAL_COMMUNITY): Payer: BLUE CROSS/BLUE SHIELD

## 2015-05-25 ENCOUNTER — Emergency Department (HOSPITAL_COMMUNITY)
Admission: EM | Admit: 2015-05-25 | Discharge: 2015-05-25 | Disposition: A | Discharge status: Home / Self Care | Payer: BLUE CROSS/BLUE SHIELD | Attending: Emergency Medicine | Admitting: Emergency Medicine

## 2015-05-25 DIAGNOSIS — Z79899 Other long term (current) drug therapy: Secondary | ICD-10-CM | POA: Insufficient documentation

## 2015-05-25 DIAGNOSIS — J45909 Unspecified asthma, uncomplicated: Secondary | ICD-10-CM | POA: Insufficient documentation

## 2015-05-25 DIAGNOSIS — Z8739 Personal history of other diseases of the musculoskeletal system and connective tissue: Secondary | ICD-10-CM | POA: Diagnosis not present

## 2015-05-25 DIAGNOSIS — R079 Chest pain, unspecified: Secondary | ICD-10-CM

## 2015-05-25 DIAGNOSIS — R0789 Other chest pain: Secondary | ICD-10-CM | POA: Diagnosis not present

## 2015-05-25 DIAGNOSIS — E039 Hypothyroidism, unspecified: Secondary | ICD-10-CM | POA: Diagnosis not present

## 2015-05-25 DIAGNOSIS — Z87442 Personal history of urinary calculi: Secondary | ICD-10-CM | POA: Diagnosis not present

## 2015-05-25 LAB — BASIC METABOLIC PANEL
Anion gap: 10 (ref 5–15)
BUN: 18 mg/dL (ref 6–20)
CALCIUM: 9.5 mg/dL (ref 8.9–10.3)
CO2: 23 mmol/L (ref 22–32)
Chloride: 105 mmol/L (ref 101–111)
Creatinine, Ser: 0.63 mg/dL (ref 0.44–1.00)
GFR calc Af Amer: >60 mL/min (ref >60)
GLUCOSE: 97 mg/dL (ref 65–99)
Potassium: 3.8 mmol/L (ref 3.5–5.1)
Sodium: 138 mmol/L (ref 135–145)

## 2015-05-25 LAB — CBC
HCT: 40.2 % (ref 36.0–46.0)
HEMOGLOBIN: 13.1 g/dL (ref 12.0–15.0)
MCH: 27.8 pg (ref 26.0–34.0)
MCHC: 32.6 g/dL (ref 30.0–36.0)
MCV: 85.2 fL (ref 78.0–100.0)
Platelets: 235 10*3/uL (ref 150–400)
RBC: 4.72 MIL/uL (ref 3.87–5.11)
RDW: 13.8 % (ref 11.5–15.5)
WBC: 9.1 10*3/uL (ref 4.0–10.5)

## 2015-05-25 LAB — I-STAT TROPONIN, ED: Troponin i, poc: 0 ng/mL (ref 0.00–0.08)

## 2015-05-25 MED ORDER — NAPROXEN 500 MG PO TABS: 500.0000 mg | ORAL_TABLET | Freq: Two times a day (BID) | ORAL | Status: DC

## 2015-05-25 NOTE — ED Notes (Addendum)
Patient reports left-sided chest pain with left arm pain/numbness. Denies N/V/D/SOB. Says, "I've had some left breast pain recently that I thought would go away." Also experiences anxiety but says her anxiety levels haven't changed recently. RR even/unlabored. No other c/c.

## 2015-05-25 NOTE — Discharge Instructions (Signed)
Avoid activities that reproduced her pain. Gentle wall stretching as demonstrated.   Chest Wall Pain Chest wall pain is pain in or around the bones and muscles of your chest. It may take up to 6 weeks to get better. It may take longer if you must stay physically active in your work and activities.  CAUSES  Chest wall pain may happen on its own. However, it may be caused by:  A viral illness like the flu.  Injury.  Coughing.  Exercise.  Arthritis.  Fibromyalgia.  Shingles. HOME CARE INSTRUCTIONS   Avoid overtiring physical activity. Try not to strain or perform activities that cause pain. This includes any activities using your chest or your abdominal and side muscles, especially if heavy weights are used.  Put ice on the sore area.  Put ice in a plastic bag.  Place a towel between your skin and the bag.  Leave the ice on for 15-20 minutes per hour while awake for the first 2 days.  Only take over-the-counter or prescription medicines for pain, discomfort, or fever as directed by your caregiver. SEEK IMMEDIATE MEDICAL CARE IF:   Your pain increases, or you are very uncomfortable.  You have a fever.  Your chest pain becomes worse.  You have new, unexplained symptoms.  You have nausea or vomiting.  You feel sweaty or lightheaded.  You have a cough with phlegm (sputum), or you cough up blood. MAKE SURE YOU:   Understand these instructions.  Will watch your condition.  Will get help right away if you are not doing well or get worse. Document Released: 11/18/2005 Document Revised: 02/10/2012 Document Reviewed: 07/15/2011 [facility] Patient Information 2015 [place], [place]. This information is not intended to replace advice given to you by your health care provider. Make sure you discuss any questions you have with your health care provider.

## 2015-05-25 NOTE — ED Provider Notes (Signed)
CSN: [id number]     Arrival date & time 05/25/15  1527 History   First MD Initiated Contact with Patient 05/25/15 1834     Chief Complaint  Patient presents with  . Chest Pain     HPI  Patient presents for evaluation of left-sided chest pain. This is been present intimately for the last 3 weeks for the last 2 days been more noticeably uncomfortable. Hurts to lift her use her arm and makes her arm feel "achy". States she has been under a great deal stress and anxiety. Breaks down here as I'm examining her. States she was concerned that she was going to be told she had a tumor. History of father and brother both died from cancer and was afraid this was going to be the case.  No actual breast pain as stated and triage note. This is in her pectoralis chest. No cough sputum production difficult breathing. No prolonged immobilization. Nonsmoker.  Past Medical History  Diagnosis Date  . Asthma   . Hypothyroidism   . Ruptured intervertebral disc     lumbar lowest in spine happened in high school and again as young adul no surgery only physical therapy  . History of kidney stones   . Allergy    Past Surgical History  Procedure Laterality Date  . Mole removal    . Lithotripsy     Family History  Problem Relation Age of Onset  . Depression Mother   . Cancer Father     non hodgkins lymphoma  . Mental illness Sister     ocd  . Cancer Brother     neuroblastoma  . Diabetes Maternal Grandmother   . COPD Maternal Grandmother   . Hypertension Maternal Grandmother   . Anesthesia problems Neg Hx   . Hypotension Neg Hx   . Pseudochol deficiency Neg Hx   . Malignant hyperthermia Neg Hx    History  Substance Use Topics  . Smoking status: Never Smoker   . Smokeless tobacco: Never Used  . Alcohol Use: 0.0 oz/week    0 Standard drinks or equivalent per week     Comment: occasionally    OB History    Gravida Para Term Preterm AB TAB SAB Ectopic Multiple Living   3 2 2  1  0 1   2      Review of Systems  Constitutional: Negative for fever, chills, diaphoresis, appetite change and fatigue.  HENT: Negative for mouth sores, sore throat and trouble swallowing.   Eyes: Negative for visual disturbance.  Respiratory: Negative for cough, chest tightness, shortness of breath and wheezing.   Cardiovascular: Positive for chest pain.  Gastrointestinal: Negative for nausea, vomiting, abdominal pain, diarrhea and abdominal distention.  Endocrine: Negative for polydipsia, polyphagia and polyuria.  Genitourinary: Negative for dysuria, frequency and hematuria.  Musculoskeletal: Negative for gait problem.  Skin: Negative for color change, pallor and rash.  Neurological: Negative for dizziness, syncope, light-headedness and headaches.  Hematological: Does not bruise/bleed easily.  Psychiatric/Behavioral: Negative for behavioral problems and confusion.      Allergies  Review of patient's allergies indicates no known allergies.  Home Medications   Prior to Admission medications   Medication Sig Start Date End Date Taking? Authorizing Provider  albuterol (PROVENTIL HFA;VENTOLIN HFA) 108 (90 BASE) MCG/ACT inhaler Inhale 2 puffs into the lungs every 6 (six) hours as needed for wheezing or shortness of breath. 09/03/14  Yes Collene Gobble, MD  citalopram (CELEXA) 20 MG tablet Take 1 tablet (20 mg  total) by mouth daily. 03/29/15  Yes Thao P Le, DO  levothyroxine (SYNTHROID, LEVOTHROID) 175 MCG tablet Take 1 tablet (175 mcg total) by mouth daily before breakfast. 03/30/15  Yes Thao P Le, DO  naproxen (NAPROSYN) 500 MG tablet Take 1 tablet (500 mg total) by mouth 2 (two) times daily. 05/25/15   Rolland Porter, MD   BP 128/73 mmHg  Pulse 92  Temp(Src) 98.2 F (36.8 C) (Oral)  Resp 17  SpO2 97%  LMP 05/21/2015 (Approximate) Physical Exam  Constitutional: She is oriented to person, place, and time. She appears well-developed and well-nourished. No distress.  HENT:  Head: Normocephalic.  Eyes:  Conjunctivae are normal. Pupils are equal, round, and reactive to light. No scleral icterus.  Neck: Normal range of motion. Neck supple. No thyromegaly present.  Cardiovascular: Normal rate and regular rhythm.  Exam reveals no gallop and no friction rub.   No murmur heard. Pulmonary/Chest: Effort normal and breath sounds normal. No respiratory distress. She has no wheezes. She has no rales.    Pain reproducible with stretch of her pectoralis.  Abdominal: Soft. Bowel sounds are normal. She exhibits no distension. There is no tenderness. There is no rebound.  Musculoskeletal: Normal range of motion.  Neurological: She is alert and oriented to person, place, and time.  Skin: Skin is warm and dry. No rash noted.  Psychiatric: She has a normal mood and affect. Her behavior is normal.    ED Course  Procedures (including critical care time) Labs Review Labs Reviewed  CBC  BASIC METABOLIC PANEL  Rosezena Sensor, ED    Imaging Review Dg Chest 2 View  05/25/2015   CLINICAL DATA:  Left breast pain for 4 months. Chest pain for 2 weeks which began to radiate into the left arm today. Initial encounter.  EXAM: CHEST  2 VIEW  COMPARISON:  None.  FINDINGS: Heart size and mediastinal contours are within normal limits. Both lungs are clear. Visualized skeletal structures are unremarkable.  IMPRESSION: Negative exam.   Electronically Signed   By: Drusilla Kanner M.D.   On: 05/25/2015 16:30     EKG Interpretation   Date/Time:  Thursday May 25 2015 15:36:35 EDT Ventricular Rate:  92 PR Interval:  136 QRS Duration: 80 QT Interval:  356 QTC Calculation: 440 R Axis:   73 Text Interpretation:  Sinus rhythm Confirmed by Fayrene Fearing  MD, Jotham Ahn ([id number]) on  05/25/2015 6:52:40 PM      MDM   Final diagnoses:  Chest wall pain    Reproducible pain. Reproduces with any maneuvers that involve her pectoralis. Normal studies here including x-ray, troponin, and EKG. Low risk for cardiac disease. Not hypoxemic  or tachycardic. Afebrile. Plan is home, and inflammatory, gentle corner stretching as demonstrated.    Rolland Porter, MD 05/25/15 Ernestina [place]

## 2015-07-19 ENCOUNTER — Other Ambulatory Visit: Payer: Self-pay | Admitting: Family Medicine

## 2015-08-15 ENCOUNTER — Other Ambulatory Visit: Payer: Self-pay | Admitting: Obstetrics and Gynecology

## 2015-08-15 DIAGNOSIS — N644 Mastodynia: Secondary | ICD-10-CM

## 2015-10-02 ENCOUNTER — Ambulatory Visit (INDEPENDENT_AMBULATORY_CARE_PROVIDER_SITE_OTHER): Payer: BLUE CROSS/BLUE SHIELD | Admitting: Family Medicine

## 2015-10-02 VITALS — BP 124/82 | HR 89 | Temp 98.7°F | Resp 20 | Ht 64.75 in | Wt 259.0 lb

## 2015-10-02 DIAGNOSIS — Z130 Encounter for screening for diseases of the blood and blood-forming organs and certain disorders involving the immune mechanism: Secondary | ICD-10-CM | POA: Diagnosis not present

## 2015-10-02 DIAGNOSIS — F418 Other specified anxiety disorders: Secondary | ICD-10-CM

## 2015-10-02 DIAGNOSIS — N910 Primary amenorrhea: Secondary | ICD-10-CM | POA: Diagnosis not present

## 2015-10-02 DIAGNOSIS — F419 Anxiety disorder, unspecified: Secondary | ICD-10-CM

## 2015-10-02 DIAGNOSIS — Z131 Encounter for screening for diabetes mellitus: Secondary | ICD-10-CM | POA: Diagnosis not present

## 2015-10-02 DIAGNOSIS — E039 Hypothyroidism, unspecified: Secondary | ICD-10-CM | POA: Diagnosis not present

## 2015-10-02 DIAGNOSIS — J4520 Mild intermittent asthma, uncomplicated: Secondary | ICD-10-CM

## 2015-10-02 DIAGNOSIS — Z0000 Encounter for general adult medical examination without abnormal findings: Secondary | ICD-10-CM

## 2015-10-02 DIAGNOSIS — F32A Depression, unspecified: Secondary | ICD-10-CM

## 2015-10-02 DIAGNOSIS — F329 Major depressive disorder, single episode, unspecified: Secondary | ICD-10-CM

## 2015-10-02 LAB — HEMOGLOBIN A1C: Hgb A1c MFr Bld: 5.8 % (ref 4.0–6.0)

## 2015-10-02 LAB — POCT GLYCOSYLATED HEMOGLOBIN (HGB A1C): Hemoglobin A1C: 5.8

## 2015-10-02 LAB — POCT URINE PREGNANCY: Preg Test, Ur: NEGATIVE

## 2015-10-02 MED ORDER — CITALOPRAM HYDROBROMIDE 20 MG PO TABS: ORAL_TABLET | ORAL | Status: DC

## 2015-10-02 NOTE — Progress Notes (Signed)
Chief Complaint:  Chief Complaint  Patient presents with  . Annual Exam    will get her flu vaccine at work tomorrow  . Possible Pregnancy    would like pregnancy test    HPI: Julie Chambers is a 34 y.o. female who [facility] today complaining of annual visit.  She is taking her thyrodi medicine regular. LMP was Sept 15, 2016 No hx of irregular periods Pap was normal in September 2016, Dr Berenda Moraleichardson's NP, she will see her in January 2016 for tubal ligation  She is getting Flu vaccine at work tomorrow, so decline She is scheduled to get a screening mammogram due to breast tenderness, has to schedule it with the breast center during her time off There has been a lot of stress at work, she is taking celexa 20 mg and feels it has helped but can use a higher dose to help with depressed mood/irritability more at home than at work She thinks she is UTD on her tetanus but not sure She was dog sitting and that has exacerbated her wheezing, she hardly ever uses her albuterol unhaler but since she has been around dogs for the last week she has had some wheezing, she forgot to use her inhaler today but when she uses it it helps.     Past Medical History  Diagnosis Date  . Asthma   . Hypothyroidism   . Ruptured intervertebral disc     lumbar lowest in spine happened in high school and again as young adul no surgery only physical therapy  . History of kidney stones   . Allergy    Past Surgical History  Procedure Laterality Date  . Mole removal    . Lithotripsy     Social History   Social History  . Marital Status: Married    Spouse Name: N/A  . Number of Children: 2  . Years of Education: N/A   Social History Main Topics  . Smoking status: Never Smoker   . Smokeless tobacco: Never Used  . Alcohol Use: 0.0 oz/week    0 Standard drinks or equivalent per week     Comment: occasionally   . Drug Use: No  . Sexual Activity:    Partners: Male    Birth Control/  Protection: Condom   Other Topics Concern  . None   Social History Narrative   Family History  Problem Relation Age of Onset  . Depression Mother   . Cancer Father     non hodgkins lymphoma  . Mental illness Sister     ocd  . Cancer Brother     neuroblastoma  . Diabetes Maternal Grandmother   . COPD Maternal Grandmother   . Hypertension Maternal Grandmother   . Anesthesia problems Neg Hx   . Hypotension Neg Hx   . Pseudochol deficiency Neg Hx   . Malignant hyperthermia Neg Hx    No Known Allergies Prior to Admission medications   Medication Sig Start Date End Date Taking? Authorizing Provider  albuterol (PROVENTIL HFA;VENTOLIN HFA) 108 (90 BASE) MCG/ACT inhaler Inhale 2 puffs into the lungs every 6 (six) hours as needed for wheezing or shortness of breath. 09/03/14  Yes Collene GobbleSteven A Daub, MD  citalopram (CELEXA) 20 MG tablet Take 1 tablet (20 mg total) by mouth daily. 03/29/15  Yes Reigna Ruperto P Vedansh Kerstetter, DO  levothyroxine (SYNTHROID, LEVOTHROID) 175 MCG tablet TAKE 1 TABLET (175 MCG TOTAL) BY MOUTH DAILY BEFORE BREAKFAST. 07/20/15  Yes Lola Lofaro P  Marykathryn Carboni, DO  naproxen (NAPROSYN) 500 MG tablet Take 1 tablet (500 mg total) by mouth 2 (two) times daily. 05/25/15  Yes Rolland Porter, MD     ROS: The patient denies fevers, chills, night sweats, unintentional weight loss, chest pain, palpitations, wheezing, dyspnea on exertion, nausea, vomiting, abdominal pain, dysuria, hematuria, melena, numbness, weakness, or tingling.  All other systems have been reviewed and were otherwise negative with the exception of those mentioned in the HPI and as above.    PHYSICAL EXAM: Filed Vitals:   10/02/15 1756  BP: 124/82  Pulse: 89  Temp: 98.7 F (37.1 C)  Resp: 20   Body mass index is 43.41 kg/(m^2).   General: Alert, no acute distress HEENT:  Normocephalic, atraumatic, oropharynx patent. EOMI, PERRLA Tm and thyroid normal Cardiovascular:  Regular rate and rhythm, no rubs murmurs or gallops.  No Carotid bruits,  radial pulse intact. No pedal edema.  Respiratory: Clear to auscultation bilaterally.  + minimal expiratory wheezes, no rales, no rhonchi.  No cyanosis, no use of accessory musculature Abdominal: No organomegaly, abdomen is soft and non-tender, positive bowel sounds. No masses. Skin: No rashes. Neurologic: Facial musculature symmetric. Psychiatric: Patient acts appropriately throughout our interaction. Lymphatic: No cervical or submandibular lymphadenopathy Musculoskeletal: Gait intact. No edema, tenderness Lerline Valdivia and UE nl ROM   LABS: Results for orders placed or performed in visit on 10/02/15  POCT urine pregnancy  Result Value Ref Range   Preg Test, Ur Negative Negative     EKG/XRAY:   Primary read interpreted by Dr. Conley Rolls at [facility].   ASSESSMENT/PLAN: Encounter Diagnoses  Name Primary?  . Delayed period   . Hypothyroidism, unspecified hypothyroidism type   . Anxiety and depression   . Routine general medical examination at a health care facility Yes  . Asthma, mild intermittent, uncomplicated   . Screening for deficiency anemia   . Screening for diabetes mellitus    34 y/o female here for annual Wheezing due to pet dander,  will call me if cont after albuterol inh use, if no improvement consider dulera inh rx  Annual labs pending, no pap and no mammogram since she has had normal pap in 08/2015 and also will schedule her own mammogram Will reeturn in 2 months for fu of changes in Celexa, she was on Celexa 20 mg daily and now will increase to 30 mg daily , if no improvement increase to 40 mg daily Will change thyroid meds if needed, stil have 5 days left, pending labs Fu prn   Gross sideeffects, risk and benefits, and alternatives of medications d/w patient. Patient is aware that all medications have potential sideeffects and we are unable to predict every sideeffect or drug-drug interaction that may occur.  Yehudis Monceaux DO  10/02/2015 6:53 PM

## 2015-10-03 LAB — LIPID PANEL
Cholesterol: 170 mg/dL (ref 125–200)
HDL: 49 mg/dL (ref >=46)
LDL Cholesterol: 108 mg/dL (ref <130)
Total CHOL/HDL Ratio: 3.5 ratio (ref <=5.0)
Triglycerides: 66 mg/dL (ref <150)
VLDL: 13 mg/dL (ref <30)

## 2015-10-03 LAB — COMPLETE METABOLIC PANEL WITHOUT GFR
CO2: 23 mmol/L (ref 20–31)
Chloride: 104 mmol/L (ref 98–110)
Creat: 0.6 mg/dL (ref 0.50–1.10)
Glucose, Bld: 88 mg/dL (ref 65–99)
Total Bilirubin: 0.3 mg/dL (ref 0.2–1.2)
Total Protein: 7.2 g/dL (ref 6.1–8.1)

## 2015-10-03 LAB — CBC
HCT: 38.7 % (ref 36.0–46.0)
Hemoglobin: 13.1 g/dL (ref 12.0–15.0)
MCH: 28.4 pg (ref 26.0–34.0)
MCHC: 33.9 g/dL (ref 30.0–36.0)
MCV: 83.8 fL (ref 78.0–100.0)
MPV: 10.7 fL (ref 8.6–12.4)
Platelets: 247 10*3/uL (ref 150–400)
RBC: 4.62 MIL/uL (ref 3.87–5.11)
RDW: 14 % (ref 11.5–15.5)
WBC: 8.7 10*3/uL (ref 4.0–10.5)

## 2015-10-03 LAB — COMPLETE METABOLIC PANEL WITH GFR
ALT: 24 U/L (ref 6–29)
AST: 17 U/L (ref 10–30)
Albumin: 4.1 g/dL (ref 3.6–5.1)
Alkaline Phosphatase: 58 U/L (ref 33–115)
BUN: 13 mg/dL (ref 7–25)
Calcium: 9.5 mg/dL (ref 8.6–10.2)
GFR, Est African American: >89 mL/min (ref >=60)
GFR, Est Non African American: >89 mL/min (ref >=60)
Potassium: 4 mmol/L (ref 3.5–5.3)
Sodium: 137 mmol/L (ref 135–146)

## 2015-10-03 LAB — TSH: TSH: 1.39 u[IU]/mL (ref 0.350–4.500)

## 2015-10-04 ENCOUNTER — Other Ambulatory Visit: Payer: Self-pay | Admitting: Family Medicine

## 2015-10-04 ENCOUNTER — Telehealth: Payer: Self-pay | Admitting: Family Medicine

## 2015-10-04 ENCOUNTER — Encounter: Payer: Self-pay | Admitting: Family Medicine

## 2015-10-04 DIAGNOSIS — E038 Other specified hypothyroidism: Secondary | ICD-10-CM

## 2015-10-04 MED ORDER — LEVOTHYROXINE SODIUM 175 MCG PO TABS: 175.0000 ug | ORAL_TABLET | Freq: Every day | ORAL | Status: DC

## 2015-10-04 NOTE — Telephone Encounter (Signed)
LM that labs normal, sent  In refills

## 2015-10-28 ENCOUNTER — Other Ambulatory Visit: Payer: Self-pay | Admitting: Family Medicine

## 2015-10-28 NOTE — Telephone Encounter (Signed)
Can we refill inhaler? 

## 2015-11-11 ENCOUNTER — Ambulatory Visit (INDEPENDENT_AMBULATORY_CARE_PROVIDER_SITE_OTHER): Payer: BLUE CROSS/BLUE SHIELD | Admitting: Internal Medicine

## 2015-11-11 VITALS — BP 106/73 | HR 97 | Temp 98.4°F | Resp 16 | Ht 64.5 in | Wt 253.5 lb

## 2015-11-11 DIAGNOSIS — J0100 Acute maxillary sinusitis, unspecified: Secondary | ICD-10-CM

## 2015-11-11 DIAGNOSIS — J4520 Mild intermittent asthma, uncomplicated: Secondary | ICD-10-CM | POA: Diagnosis not present

## 2015-11-11 MED ORDER — AMOXICILLIN 500 MG PO CAPS
1000.0000 mg | ORAL_CAPSULE | Freq: Two times a day (BID) | ORAL | Status: AC
Start: 2015-11-11 — End: 2015-11-21

## 2015-11-11 MED ORDER — PREDNISONE 20 MG PO TABS
ORAL_TABLET | ORAL | Status: DC
Start: 2015-11-11 — End: 2015-12-15

## 2015-11-11 NOTE — Progress Notes (Signed)
Subjective:  This chart was scribed for Ellamae Siaobert Lev Cervone, MD by Stann Oresung-Kai Tsai, Medical Scribe. This patient was seen in Room 1 and the patient's care was started at 12:31 PM.    Patient ID: Julie Chambers, female    DOB: [birth year], 34 y.o.   MRN: [id number] Chief Complaint  Patient presents with  . Cough    x 8 days  . Shortness of Breath    HPI Julie Chambers is a 34 y.o. female who [facility] complaining of gradual onset coughs that started 8 days ago. Her coughs have been keeping her up at night and feels fatigue due to this. When she wakes up in the morning, she would have blow out nasal discharge. She has some wheezing and has been using her inhaler for asthma. She states that her daughter had similar symptoms last week. She denies fever.   Patient Active Problem List   Diagnosis Date Noted  . Anxiety and depression 12/10/2013  . Hypothyroidism 03/20/2013  . Asthma 03/20/2013    Current outpatient prescriptions:  .  citalopram (CELEXA) 20 MG tablet, Take 1 and 1/2 tabs PO daily, Disp: 45 tablet, Rfl: 5 .  levothyroxine (SYNTHROID, LEVOTHROID) 175 MCG tablet, Take 1 tablet (175 mcg total) by mouth daily before breakfast., Disp: 90 tablet, Rfl: 3 .  naproxen (NAPROSYN) 500 MG tablet, Take 1 tablet (500 mg total) by mouth 2 (two) times daily., Disp: 30 tablet, Rfl: 0 .  VENTOLIN HFA 108 (90 BASE) MCG/ACT inhaler, INHALE TWO PUFFS BY MOUTH EVERY FOUR HOURS AS NEEDED FOR WHEEZING OR SHORTNESS OF BREATH, Disp: 18 Inhaler, Rfl: 0  Review of Systems  Constitutional: Positive for fatigue. Negative for fever and chills.  HENT: Positive for congestion, rhinorrhea and sore throat.   Respiratory: Positive for cough, shortness of breath and wheezing.   Gastrointestinal: Negative for nausea, vomiting, diarrhea and constipation.  Neurological: Negative for headaches.       Objective:   Physical Exam  Constitutional: She is oriented to person, place, and time. She appears  well-developed and well-nourished. No distress.  HENT:  Head: Normocephalic and atraumatic.  Right Ear: Tympanic membrane normal.  Left Ear: Tympanic membrane normal.  Mouth/Throat: Oropharynx is clear and moist.  nares with purulent mucous  Eyes: EOM are normal. Pupils are equal, round, and reactive to light.  Neck: Neck supple.  Cardiovascular: Normal rate.   Pulmonary/Chest: Effort normal. No respiratory distress. She has wheezes (wheezing with forced expiration bilaterally).  Musculoskeletal: Normal range of motion.  Lymphadenopathy:    She has no cervical adenopathy.  Neurological: She is alert and oriented to person, place, and time.  Skin: Skin is warm and dry.  Psychiatric: She has a normal mood and affect. Her behavior is normal.  Nursing note and vitals reviewed.   BP 106/73 mmHg  Pulse 97  Temp(Src) 98.4 F (36.9 C) (Oral)  Resp 16  Ht 5' 4.5" (1.638 m)  Wt 253 lb 8 oz (114.987 kg)  BMI 42.86 kg/m2  SpO2 97%  LMP 10/27/2015     Assessment & Plan:  RAD (reactive airway disease), mild intermittent, uncomplicated  Acute maxillary sinusitis, recurrence not specified  Has albuterol Meds ordered this encounter  Medications  . amoxicillin (AMOXIL) 500 MG capsule    Sig: Take 2 capsules (1,000 mg total) by mouth 2 (two) times daily.    Dispense:  40 capsule    Refill:  0  . predniSONE (DELTASONE) 20 MG tablet    Sig: 3/3/2/2/1/1  single daily dose for 6 days    Dispense:  12 tablet    Refill:  0     By signing my name below, I, Stann Ore, attest that this documentation has been prepared under the direction and in the presence of Ellamae Sia, MD. Electronically Signed: Stann Ore, Scribe. 11/11/2015 , 12:31 PM .  I have completed the patient encounter in its entirety as documented by the scribe, with editing by me where necessary. Tajuan Dufault P. Merla Riches, M.D.

## 2015-12-05 NOTE — Patient Instructions (Addendum)
Your procedure is scheduled on:  Friday, Jan. 13, 2017  Enter through the Main Entrance of [facility] at: 6:00 A.M.  Pick up the phone at the desk and dial [phone].  Call this number if you have problems the morning of surgery: [phone].  Remember: Do NOT eat food or drink after:  Midnight Thursday, Jan. 12, 2017 Take these medicines the morning of surgery with a SIP OF WATER: Levothyroxine  Do NOT wear jewelry (body piercing), metal hair clips/bobby pins, make-up, or nail polish. Do NOT wear lotions, powders, or perfumes.  You may wear deoderant. Do NOT shave for 48 hours prior to surgery. Do NOT bring valuables to the hospital. Contacts, dentures, or bridgework may not be worn into surgery.  Have a responsible adult drive you home and stay with you for 24 hours after your procedure

## 2015-12-06 ENCOUNTER — Encounter (HOSPITAL_COMMUNITY): Payer: Self-pay

## 2015-12-06 ENCOUNTER — Encounter (HOSPITAL_COMMUNITY)
Admission: RE | Admit: 2015-12-06 | Discharge: 2015-12-06 | Disposition: A | Discharge status: Home / Self Care | Payer: BLUE CROSS/BLUE SHIELD | Source: Ambulatory Visit | Attending: Obstetrics and Gynecology | Admitting: Obstetrics and Gynecology

## 2015-12-06 DIAGNOSIS — Z01818 Encounter for other preprocedural examination: Secondary | ICD-10-CM | POA: Diagnosis not present

## 2015-12-06 HISTORY — DX: Anxiety disorder, unspecified: F41.9

## 2015-12-06 HISTORY — DX: Depression, unspecified: F32.A

## 2015-12-06 HISTORY — DX: Major depressive disorder, single episode, unspecified: F32.9

## 2015-12-06 HISTORY — DX: Tremor, unspecified: R25.1

## 2015-12-06 HISTORY — DX: Complete or unspecified spontaneous abortion without complication: O03.9

## 2015-12-06 LAB — CBC
HCT: 37.5 % (ref 36.0–46.0)
HEMOGLOBIN: 12.8 g/dL (ref 12.0–15.0)
MCH: 28.7 pg (ref 26.0–34.0)
MCHC: 34.1 g/dL (ref 30.0–36.0)
MCV: 84.1 fL (ref 78.0–100.0)
Platelets: 237 10*3/uL (ref 150–400)
RBC: 4.46 MIL/uL (ref 3.87–5.11)
RDW: 13.9 % (ref 11.5–15.5)
WBC: 9.2 10*3/uL (ref 4.0–10.5)

## 2015-12-13 NOTE — H&P (Signed)
Julie BlueJennie Bolon is an 35 y.o. female  [phone] who presents for tubal fulguration for permanent sterilization.  Her LMP was 12/05/15 and run about every 35 days.  She uses condoms consistently for birth control.  Pertinent Gynecological History:  OB History:  Menstrual History:  No LMP recorded.    Past Medical History  Diagnosis Date  . Asthma   . Hypothyroidism   . Ruptured intervertebral disc     lumbar lowest in spine happened in high school and again as young adul no surgery only physical therapy  . History of kidney stones   . Allergy   . Anxiety   . Depression   . Spontaneous abortion   . Tremor of left hand     Past Surgical History  Procedure Laterality Date  . Mole removal    . Lithotripsy      Family History  Problem Relation Age of Onset  . Depression Mother   . Cancer Father     non hodgkins lymphoma  . Mental illness Sister     ocd  . Cancer Brother     neuroblastoma  . Diabetes Maternal Grandmother   . COPD Maternal Grandmother   . Hypertension Maternal Grandmother   . Anesthesia problems Neg Hx   . Hypotension Neg Hx   . Pseudochol deficiency Neg Hx   . Malignant hyperthermia Neg Hx     Social History:  reports that she has never smoked. She has never used smokeless tobacco. She reports that she drinks alcohol. She reports that she does not use illicit drugs.  Allergies: No Known Allergies  No prescriptions prior to admission    Review of Systems  Gastrointestinal: Negative for abdominal pain.  Neurological: Negative for headaches.    There were no vitals taken for this visit. Physical Exam  Constitutional: She is oriented to person, place, and time.  overweight  Cardiovascular: Normal rate and regular rhythm.   Respiratory: Effort normal.  GI: Soft.  Genitourinary: Vagina normal and uterus normal.  Neurological: She is alert and oriented to person, place, and time.  Psychiatric: She has a normal mood and affect.    No results found  for this or any previous visit (from the past 24 hour(s)).  No results found.  Assessment/Plan: d/Chambers pt her laparoscopic tubal fulguration in detail. d/Chambers her risks and benefits including bleeding, infection, and possible damage to bowel and bladder. We discussed that should a complication arise she might need a larger incision and have a much longer recovery. We discussed the risk of failure of 1/100 with pregnancy occuring and an increased risk of ectopic pregnancy should she conceive after the surgery. Pt desires to proceed.  Oliver PilaICHARDSON,Julie Chambers 12/13/2015, 8:02 AM

## 2015-12-14 ENCOUNTER — Encounter (HOSPITAL_COMMUNITY): Payer: Self-pay | Admitting: Anesthesiology

## 2015-12-14 NOTE — Anesthesia Preprocedure Evaluation (Addendum)
Anesthesia Evaluation  Patient identified by MRN, date of birth, ID band  Reviewed: Allergy & Precautions, NPO status , Patient's Chart, lab work & pertinent test results  Airway Mallampati: III  TM Distance: >3 FB Neck ROM: Full    Dental no notable dental hx. (+) Teeth Intact   Pulmonary asthma ,    Pulmonary exam normal breath sounds clear to auscultation       Cardiovascular negative cardio ROS Normal cardiovascular exam Rhythm:Regular Rate:Normal     Neuro/Psych PSYCHIATRIC DISORDERS Anxiety Depression negative neurological ROS     GI/Hepatic negative GI ROS, Neg liver ROS,   Endo/Other  Hypothyroidism Morbid obesity  Renal/GU Hx/o renal calculi     Musculoskeletal Hx/o HNP L5-S1   Abdominal (+) + obese,   Peds  Hematology negative hematology ROS (+)   Anesthesia Other Findings   Reproductive/Obstetrics Desires sterilization                            Anesthesia Physical Anesthesia Plan  ASA: III  Anesthesia Plan: General   Post-op Pain Management:    Induction: Intravenous  Airway Management Planned: Oral ETT  Additional Equipment:   Intra-op Plan:   Post-operative Plan: Extubation in OR  Informed Consent: I have reviewed the patients History and Physical, chart, labs and discussed the procedure including the risks, benefits and alternatives for the proposed anesthesia with the patient or authorized representative who has indicated his/her understanding and acceptance.   Dental advisory given  Plan Discussed with: Anesthesiologist, CRNA and Surgeon  Anesthesia Plan Comments:         Anesthesia Quick Evaluation

## 2015-12-15 ENCOUNTER — Ambulatory Visit (HOSPITAL_COMMUNITY)
Admission: RE | Admit: 2015-12-15 | Discharge: 2015-12-15 | Disposition: A | Discharge status: Home / Self Care | Payer: BLUE CROSS/BLUE SHIELD | Source: Ambulatory Visit | Attending: Obstetrics and Gynecology | Admitting: Obstetrics and Gynecology

## 2015-12-15 ENCOUNTER — Ambulatory Visit (HOSPITAL_COMMUNITY): Payer: BLUE CROSS/BLUE SHIELD | Admitting: Anesthesiology

## 2015-12-15 ENCOUNTER — Encounter (HOSPITAL_COMMUNITY): Payer: Self-pay | Admitting: Emergency Medicine

## 2015-12-15 ENCOUNTER — Encounter (HOSPITAL_COMMUNITY)
Admission: RE | Disposition: A | Discharge status: Home / Self Care | Payer: Self-pay | Source: Ambulatory Visit | Attending: Obstetrics and Gynecology

## 2015-12-15 DIAGNOSIS — Z302 Encounter for sterilization: Secondary | ICD-10-CM | POA: Insufficient documentation

## 2015-12-15 HISTORY — PX: LAPAROSCOPIC TUBAL LIGATION: SHX1937

## 2015-12-15 LAB — PREGNANCY, URINE: PREG TEST UR: NEGATIVE

## 2015-12-15 SURGERY — LIGATION, FALLOPIAN TUBE, LAPAROSCOPIC
Anesthesia: General | Site: Abdomen | Laterality: Bilateral

## 2015-12-15 MED ORDER — DEXAMETHASONE SODIUM PHOSPHATE 4 MG/ML IJ SOLN
INTRAMUSCULAR | Status: AC
  Filled 2015-12-15: qty 1

## 2015-12-15 MED ORDER — FENTANYL CITRATE (PF) 100 MCG/2ML IJ SOLN
INTRAMUSCULAR | Status: DC | PRN
  Administered 2015-12-15: 100 ug via INTRAVENOUS
  Administered 2015-12-15: 50 ug via INTRAVENOUS
  Administered 2015-12-15: 100 ug via INTRAVENOUS
  Administered 2015-12-15: 50 ug via INTRAVENOUS

## 2015-12-15 MED ORDER — KETOROLAC TROMETHAMINE 30 MG/ML IJ SOLN
INTRAMUSCULAR | Status: DC | PRN
  Administered 2015-12-15: 30 mg via INTRAVENOUS

## 2015-12-15 MED ORDER — METOCLOPRAMIDE HCL 5 MG/ML IJ SOLN
10.0000 mg | Freq: Once | INTRAMUSCULAR | Status: AC | PRN
  Administered 2015-12-15: 10 mg via INTRAVENOUS

## 2015-12-15 MED ORDER — KETOROLAC TROMETHAMINE 30 MG/ML IJ SOLN
INTRAMUSCULAR | Status: AC
  Filled 2015-12-15: qty 1

## 2015-12-15 MED ORDER — GLYCOPYRROLATE 0.2 MG/ML IJ SOLN
INTRAMUSCULAR | Status: AC
  Filled 2015-12-15: qty 4

## 2015-12-15 MED ORDER — MIDAZOLAM HCL 2 MG/2ML IJ SOLN
INTRAMUSCULAR | Status: DC | PRN
  Administered 2015-12-15: 2 mg via INTRAVENOUS

## 2015-12-15 MED ORDER — ONDANSETRON HCL 4 MG/2ML IJ SOLN
INTRAMUSCULAR | Status: DC | PRN
  Administered 2015-12-15: 4 mg via INTRAVENOUS

## 2015-12-15 MED ORDER — BUPIVACAINE HCL (PF) 0.25 % IJ SOLN
INTRAMUSCULAR | Status: DC | PRN
  Administered 2015-12-15: 10 mL

## 2015-12-15 MED ORDER — LACTATED RINGERS IV SOLN: INTRAVENOUS | Status: DC

## 2015-12-15 MED ORDER — PROPOFOL 10 MG/ML IV BOLUS
INTRAVENOUS | Status: AC
  Filled 2015-12-15: qty 20

## 2015-12-15 MED ORDER — HYDROMORPHONE HCL 1 MG/ML IJ SOLN
0.2500 mg | INTRAMUSCULAR | Status: DC | PRN
Start: 2015-12-15 — End: 2015-12-15

## 2015-12-15 MED ORDER — OXYCODONE-ACETAMINOPHEN 5-325 MG PO TABS: 1.0000 | ORAL_TABLET | ORAL | Status: DC | PRN

## 2015-12-15 MED ORDER — METOCLOPRAMIDE HCL 5 MG/ML IJ SOLN
INTRAMUSCULAR | Status: AC
  Administered 2015-12-15: 10 mg via INTRAVENOUS
  Filled 2015-12-15: qty 2

## 2015-12-15 MED ORDER — ROCURONIUM BROMIDE 100 MG/10ML IV SOLN
INTRAVENOUS | Status: DC | PRN
  Administered 2015-12-15: 50 mg via INTRAVENOUS

## 2015-12-15 MED ORDER — ALBUTEROL SULFATE HFA 108 (90 BASE) MCG/ACT IN AERS
INHALATION_SPRAY | RESPIRATORY_TRACT | Status: AC
  Filled 2015-12-15: qty 6.7

## 2015-12-15 MED ORDER — DEXAMETHASONE SODIUM PHOSPHATE 10 MG/ML IJ SOLN
INTRAMUSCULAR | Status: DC | PRN
  Administered 2015-12-15: 4 mg via INTRAVENOUS

## 2015-12-15 MED ORDER — MIDAZOLAM HCL 2 MG/2ML IJ SOLN
INTRAMUSCULAR | Status: AC
  Filled 2015-12-15: qty 2

## 2015-12-15 MED ORDER — NEOSTIGMINE METHYLSULFATE 10 MG/10ML IV SOLN
INTRAVENOUS | Status: AC
  Filled 2015-12-15: qty 1

## 2015-12-15 MED ORDER — LACTATED RINGERS IV SOLN
INTRAVENOUS | Status: DC
  Administered 2015-12-15 (×5): via INTRAVENOUS

## 2015-12-15 MED ORDER — LIDOCAINE HCL (CARDIAC) 20 MG/ML IV SOLN
INTRAVENOUS | Status: DC | PRN
  Administered 2015-12-15: 100 mg via INTRAVENOUS

## 2015-12-15 MED ORDER — PROPOFOL 10 MG/ML IV BOLUS
INTRAVENOUS | Status: DC | PRN
  Administered 2015-12-15: 200 mg via INTRAVENOUS

## 2015-12-15 MED ORDER — HYDROCODONE-ACETAMINOPHEN 7.5-325 MG PO TABS: 1.0000 | ORAL_TABLET | Freq: Once | ORAL | Status: DC | PRN

## 2015-12-15 MED ORDER — BUPIVACAINE HCL (PF) 0.25 % IJ SOLN
INTRAMUSCULAR | Status: AC
  Filled 2015-12-15: qty 30

## 2015-12-15 MED ORDER — IBUPROFEN 200 MG PO TABS: 600.0000 mg | ORAL_TABLET | Freq: Four times a day (QID) | ORAL | Status: DC | PRN

## 2015-12-15 MED ORDER — ONDANSETRON HCL 4 MG/2ML IJ SOLN
INTRAMUSCULAR | Status: AC
  Filled 2015-12-15: qty 2

## 2015-12-15 MED ORDER — GLYCOPYRROLATE 0.2 MG/ML IJ SOLN
INTRAMUSCULAR | Status: DC | PRN
  Administered 2015-12-15: .8 mg via INTRAVENOUS

## 2015-12-15 MED ORDER — SCOPOLAMINE 1 MG/3DAYS TD PT72
MEDICATED_PATCH | TRANSDERMAL | Status: AC
  Administered 2015-12-15: 1.5 mg via TRANSDERMAL
  Filled 2015-12-15: qty 1

## 2015-12-15 MED ORDER — MEPERIDINE HCL 25 MG/ML IJ SOLN: 6.2500 mg | INTRAMUSCULAR | Status: DC | PRN

## 2015-12-15 MED ORDER — ROCURONIUM BROMIDE 100 MG/10ML IV SOLN
INTRAVENOUS | Status: AC
  Filled 2015-12-15: qty 1

## 2015-12-15 MED ORDER — NEOSTIGMINE METHYLSULFATE 10 MG/10ML IV SOLN
INTRAVENOUS | Status: DC | PRN
  Administered 2015-12-15: 5 mg via INTRAVENOUS

## 2015-12-15 MED ORDER — LIDOCAINE HCL (CARDIAC) 20 MG/ML IV SOLN
INTRAVENOUS | Status: AC
  Filled 2015-12-15: qty 5

## 2015-12-15 MED ORDER — IPRATROPIUM-ALBUTEROL 20-100 MCG/ACT IN AERS
INHALATION_SPRAY | RESPIRATORY_TRACT | Status: DC | PRN
  Administered 2015-12-15: 2 via RESPIRATORY_TRACT

## 2015-12-15 MED ORDER — FENTANYL CITRATE (PF) 250 MCG/5ML IJ SOLN
INTRAMUSCULAR | Status: AC
  Filled 2015-12-15: qty 5

## 2015-12-15 MED ORDER — SCOPOLAMINE 1 MG/3DAYS TD PT72
1.0000 | MEDICATED_PATCH | Freq: Once | TRANSDERMAL | Status: DC
  Administered 2015-12-15: 1.5 mg via TRANSDERMAL

## 2015-12-15 MED ORDER — FENTANYL CITRATE (PF) 100 MCG/2ML IJ SOLN
INTRAMUSCULAR | Status: AC
  Filled 2015-12-15: qty 2

## 2015-12-15 SURGICAL SUPPLY — 24 items
CATH ROBINSON RED A/P 16FR (CATHETERS) ×3 IMPLANT
CHLORAPREP W/TINT 26ML (MISCELLANEOUS) ×3 IMPLANT
CLOTH BEACON ORANGE TIMEOUT ST (SAFETY) ×3 IMPLANT
DRSG COVADERM PLUS 2X2 (GAUZE/BANDAGES/DRESSINGS) ×6 IMPLANT
DRSG OPSITE POSTOP 3X4 (GAUZE/BANDAGES/DRESSINGS) ×3 IMPLANT
GLOVE BIO SURGEON STRL SZ 6.5 (GLOVE) ×2 IMPLANT
GLOVE BIO SURGEONS STRL SZ 6.5 (GLOVE) ×1
GLOVE BIOGEL PI IND STRL 7.0 (GLOVE) ×1 IMPLANT
GLOVE BIOGEL PI INDICATOR 7.0 (GLOVE) ×2
GOWN STRL REUS W/TWL LRG LVL3 (GOWN DISPOSABLE) ×6 IMPLANT
LIQUID BAND (GAUZE/BANDAGES/DRESSINGS) ×3 IMPLANT
NEEDLE INSUFFLATION 120MM (ENDOMECHANICALS) ×3 IMPLANT
PACK LAPAROSCOPY BASIN (CUSTOM PROCEDURE TRAY) ×3 IMPLANT
PAD TRENDELENBURG POSITION (MISCELLANEOUS) ×3 IMPLANT
SLEEVE XCEL OPT CAN 5 100 (ENDOMECHANICALS) ×3 IMPLANT
SUT VIC AB 3-0 PS2 18 (SUTURE)
SUT VIC AB 3-0 PS2 18XBRD (SUTURE) IMPLANT
SUT VIC AB 4-0 PS2 27 (SUTURE) ×3 IMPLANT
SUT VICRYL 0 UR6 27IN ABS (SUTURE) ×3 IMPLANT
TOWEL OR 17X24 6PK STRL BLUE (TOWEL DISPOSABLE) ×6 IMPLANT
TROCAR XCEL NON-BLD 11X100MML (ENDOMECHANICALS) ×3 IMPLANT
TROCAR XCEL NON-BLD 5MMX100MML (ENDOMECHANICALS) ×3 IMPLANT
WARMER LAPAROSCOPE (MISCELLANEOUS) ×3 IMPLANT
WATER STERILE IRR 1000ML POUR (IV SOLUTION) ×3 IMPLANT

## 2015-12-15 NOTE — Anesthesia Postprocedure Evaluation (Signed)
Anesthesia Post Note  Patient: Julie Chambers  Procedure(s) Performed: Procedure(s) (LRB): LAPAROSCOPIC BILATERAL TUBAL LIGATION (Bilateral)  Patient location during evaluation: PACU Anesthesia Type: General Level of consciousness: awake and alert and oriented Pain management: pain level controlled Vital Signs Assessment: post-procedure vital signs reviewed and stable Respiratory status: spontaneous breathing, nonlabored ventilation and respiratory function stable Cardiovascular status: blood pressure returned to baseline and stable Postop Assessment: no signs of nausea or vomiting Anesthetic complications: no    Last Vitals:  Filed Vitals:   12/15/15 0845 12/15/15 0900  BP: 109/61 110/62  Pulse: 85 73  Temp:    Resp: 16 20    Last Pain: There were no vitals filed for this visit.               Ainara Eldridge A.

## 2015-12-15 NOTE — Anesthesia Procedure Notes (Signed)
Procedure Name: Intubation Date/Time: 12/15/2015 7:24 AM Performed by: Yolonda KidaARVER, Mandy Fitzwater L Pre-anesthesia Checklist: Patient identified, Emergency Drugs available, Suction available and Patient being monitored Patient Re-evaluated:Patient Re-evaluated prior to inductionOxygen Delivery Method: Circle system utilized Preoxygenation: Pre-oxygenation with 100% oxygen Intubation Type: IV induction Ventilation: Mask ventilation without difficulty Laryngoscope Size: Mac and 3 Grade View: Grade I Tube type: Oral Tube size: 7.0 mm Number of attempts: 1 Airway Equipment and Method: Stylet Placement Confirmation: ETT inserted through vocal cords under direct vision,  positive ETCO2,  CO2 detector and breath sounds checked- equal and bilateral Secured at: 21 cm Tube secured with: Tape

## 2015-12-15 NOTE — Transfer of Care (Signed)
Immediate Anesthesia Transfer of Care Note  Patient: Julie Chambers  Procedure(s) Performed: Procedure(s): LAPAROSCOPIC BILATERAL TUBAL LIGATION (Bilateral)  Patient Location: PACU  Anesthesia Type:General  Level of Consciousness: awake, alert , oriented and patient cooperative  Airway & Oxygen Therapy: Patient Spontanous Breathing and Patient connected to nasal cannula oxygen  Post-op Assessment: Report given to RN and Post -op Vital signs reviewed and stable  Post vital signs: Reviewed and stable  Last Vitals: There were no vitals filed for this visit. spo2 of 98% in PACU on 4L [place]  Complications: No apparent anesthesia complications

## 2015-12-15 NOTE — Progress Notes (Signed)
Patient ID: Julie Chambers, female   DOB: 1981/03/28, 35 y.o.   MRN: [id number] Pt with no changes in dictated H&P and brief exam WNL.  Ready to proceed.

## 2015-12-15 NOTE — Discharge Instructions (Signed)
DISCHARGE INSTRUCTIONS: Laparoscopy  The following instructions have been prepared to help you care for yourself upon your return home today.  May remove Scop patch on or before 12/17/15.  May take Ibuprofen after 1:53 pm as needed for pain.  Use Incentive Spirometer 10 times every hours during the day time until no longer drowsy from anesthesia.  Wound care:  Do not get the incision wet for the first 24 hours. The incision should be kept clean and dry.  The Band-Aids or dressings may be removed the day after surgery.  Should the incision become sore, red, and swollen after the first week, check with your doctor.  Personal hygiene:  Shower the day after your procedure.  Activity and limitations:  Do NOT drive or operate any equipment today.  Do NOT lift anything more than 15 pounds for 2-3 weeks after surgery.  Do NOT rest in bed all day.  Walking is encouraged. Walk each day, starting slowly with 5-minute walks 3 or 4 times a day. Slowly increase the length of your walks.  Walk up and down stairs slowly.  Do NOT do strenuous activities, such as golfing, playing tennis, bowling, running, biking, weight lifting, gardening, mowing, or vacuuming for 2-4 weeks. Ask your doctor when it is okay to start.  Diet: Eat a light meal as desired this evening. You may resume your usual diet tomorrow.  Return to work: This is dependent on the type of work you do. For the most part you can return to a desk job within a week of surgery. If you are more active at work, please discuss this with your doctor.  What to expect after your surgery: You may have a slight burning sensation when you urinate on the first day. You may have a very small amount of blood in the urine. Expect to have a small amount of vaginal discharge/light bleeding for 1-2 weeks. It is not unusual to have abdominal soreness and bruising for up to 2 weeks. You may be tired and need more rest for about 1 week. You may  experience shoulder pain for 24-72 hours. Lying flat in bed may relieve it.  Call your doctor for any of the following:  Develop a fever of 100.4 or greater  Inability to urinate 6 hours after discharge from hospital  Severe pain not relieved by pain medications  Persistent of heavy bleeding at incision site  Redness or swelling around incision site after a week  Increasing nausea or vomiting

## 2015-12-15 NOTE — Op Note (Signed)
Operative Note    Preoperative Diagnosis Desires permanent sterility  Postoperative Diagnosis same  Procedure Laparoscopic tubal fulguration  Surgeon Huel CoteKathy Haruki Arnold  Anesthesia General  Fluids: EBL minimal UOP 50 cc straight cath prior to procedure IVF 1200cc LR  Findings Nml pelvic anatomy, liver and abdominal anatomy  Specimen none  Procedure Note   Patient was taken to the operating room where general anesthesia was obtained without difficulty. She was then prepped and draped in the normal sterile fashion in the dorsal lithotomy position. An appropriate timeout was performed. A speculum was then placed within the vagina and a Hulka tenaculum placed within the cervix for uterine manipulation. The bladder was emptied. Attention was then turned to the patient's abdomen after draping where the infraumbilical area was injected with approximately 10 cc of quarter percent Marcaine. A 1 cm incision was then made within the umbilicus and the varies needle easily introduced into the peritoneal cavity.  Gas flow was then applied and a pneumoperitoneum obtained with approximate 3 L of CO2 gas. The varies needle was then removed and the 11 mm trocar was easily introduced into the abdomen. With patient in Trendelenburg the uterus and tubes and ovaries were inspected with findings as previously stated. A bipolar cautery was then introduced through the operative scope and the left fallopian tube grasped approximately 3 cm from the uterine cornua and cauterized in 2 sequential areas with good blanching noted. Attention was then turned to the right fallopian tube which was likewise grasped proximally 3 cm from the uterine cornua and cauterized into sequential spots with good blanching noted there as well. The remainder of the pelvis and abdomen were inspected and found to be normal and a four quadrant view revealed no bleeding or obvious injuries. The instruments were removed from the abdomen and  the pneumoperitoneum reduced through the trocar. The trocar was removed and the infraumbilical incision closed with one deep stitch of 0 Vicryl and a subcuticular stitch of 3-0 Vicryl. Dermabond and a bandage were placed. Patient was then awakened and taken to the recovery room in good condition.

## 2015-12-18 ENCOUNTER — Encounter (HOSPITAL_COMMUNITY): Payer: Self-pay | Admitting: Obstetrics and Gynecology

## 2016-05-06 ENCOUNTER — Emergency Department (HOSPITAL_COMMUNITY): Payer: BLUE CROSS/BLUE SHIELD

## 2016-05-06 ENCOUNTER — Emergency Department (HOSPITAL_COMMUNITY)
Admission: EM | Admit: 2016-05-06 | Discharge: 2016-05-06 | Disposition: A | Discharge status: Home / Self Care | Payer: BLUE CROSS/BLUE SHIELD | Attending: Emergency Medicine | Admitting: Emergency Medicine

## 2016-05-06 ENCOUNTER — Encounter (HOSPITAL_COMMUNITY): Payer: Self-pay | Admitting: Emergency Medicine

## 2016-05-06 DIAGNOSIS — R079 Chest pain, unspecified: Secondary | ICD-10-CM

## 2016-05-06 DIAGNOSIS — J45909 Unspecified asthma, uncomplicated: Secondary | ICD-10-CM | POA: Diagnosis not present

## 2016-05-06 DIAGNOSIS — F329 Major depressive disorder, single episode, unspecified: Secondary | ICD-10-CM | POA: Insufficient documentation

## 2016-05-06 DIAGNOSIS — R0789 Other chest pain: Secondary | ICD-10-CM | POA: Insufficient documentation

## 2016-05-06 DIAGNOSIS — Z79899 Other long term (current) drug therapy: Secondary | ICD-10-CM | POA: Diagnosis not present

## 2016-05-06 LAB — BASIC METABOLIC PANEL
ANION GAP: 7 (ref 5–15)
BUN: 16 mg/dL (ref 6–20)
CALCIUM: 9.3 mg/dL (ref 8.9–10.3)
CO2: 26 mmol/L (ref 22–32)
Chloride: 106 mmol/L (ref 101–111)
Creatinine, Ser: 0.57 mg/dL (ref 0.44–1.00)
GFR calc Af Amer: >60 mL/min (ref >60)
Glucose, Bld: 90 mg/dL (ref 65–99)
POTASSIUM: 3.6 mmol/L (ref 3.5–5.1)
SODIUM: 139 mmol/L (ref 135–145)

## 2016-05-06 LAB — CBC
HEMATOCRIT: 38.7 % (ref 36.0–46.0)
HEMOGLOBIN: 13.5 g/dL (ref 12.0–15.0)
MCH: 28.4 pg (ref 26.0–34.0)
MCHC: 34.9 g/dL (ref 30.0–36.0)
MCV: 81.5 fL (ref 78.0–100.0)
Platelets: 247 10*3/uL (ref 150–400)
RBC: 4.75 MIL/uL (ref 3.87–5.11)
RDW: 13.3 % (ref 11.5–15.5)
WBC: 7.3 10*3/uL (ref 4.0–10.5)

## 2016-05-06 LAB — I-STAT TROPONIN, ED
TROPONIN I, POC: 0 ng/mL (ref 0.00–0.08)
TROPONIN I, POC: 0 ng/mL (ref 0.00–0.08)

## 2016-05-06 MED ORDER — IBUPROFEN 200 MG PO TABS
600.0000 mg | ORAL_TABLET | Freq: Once | ORAL | Status: AC
  Administered 2016-05-06: 600 mg via ORAL
  Filled 2016-05-06: qty 3

## 2016-05-06 NOTE — ED Notes (Signed)
Patient was alert, oriented and stable upon discharge. RN went over AVS and patient had no further questions.  

## 2016-05-06 NOTE — Discharge Instructions (Signed)

## 2016-05-06 NOTE — ED Provider Notes (Signed)
CSN: [id number]     Arrival date & time 05/06/16  1116 History   First MD Initiated Contact with Patient 05/06/16 1325     Chief Complaint  Patient presents with  . Chest Pain   Patient is a 35 y.o. female presenting with chest pain.  Chest Pain Associated symptoms: no abdominal pain, no cough, no fever, no nausea, no palpitations, no shortness of breath and not vomiting      35 year old female who presents with chest pain for the past day. PMH significant for asthma and anxiety. She states it started all of a sudden when she was lying in bed. The pain is on the left side of the chest wall, it is intermittent, and  non-radiating. No aggravating/alleviating factors. She reports associated left arm numbness. She has had a similar episode which was diagnosed as chest wall pain. Denies fever, chills, neck pain, palpitations, SOB, cough, abdominal pain, N/V. Denies hx of HTN, HLD, DM, GERD, and is a non-smoker. No immediate family hx of heart disease. Notably she has been having breast pain and has a mammogram scheduled this week however she feels that this is different from her breast pain.   Past Medical History  Diagnosis Date  . Asthma   . Hypothyroidism   . Ruptured intervertebral disc     lumbar lowest in spine happened in high school and again as young adul no surgery only physical therapy  . History of kidney stones   . Allergy   . Anxiety   . Depression   . Spontaneous abortion   . Tremor of left hand    Past Surgical History  Procedure Laterality Date  . Mole removal    . Lithotripsy    . Laparoscopic tubal ligation Bilateral 12/15/2015    Procedure: LAPAROSCOPIC BILATERAL TUBAL LIGATION;  Surgeon: Huel CoteKathy Richardson, MD;  Location: [facility];  Service: Gynecology;  Laterality: Bilateral;   Family History  Problem Relation Age of Onset  . Depression Mother   . Cancer Father     non hodgkins lymphoma  . Mental illness Sister     ocd  . Cancer Brother     neuroblastoma  .  Diabetes Maternal Grandmother   . COPD Maternal Grandmother   . Hypertension Maternal Grandmother   . Anesthesia problems Neg Hx   . Hypotension Neg Hx   . Pseudochol deficiency Neg Hx   . Malignant hyperthermia Neg Hx    Social History  Substance Use Topics  . Smoking status: Never Smoker   . Smokeless tobacco: Never Used  . Alcohol Use: 0.0 oz/week    0 Standard drinks or equivalent per week     Comment: occasionally    OB History    Gravida Para Term Preterm AB TAB SAB Ectopic Multiple Living   3 2 2  1  0 1   2     Review of Systems  Constitutional: Negative for fever.  Respiratory: Negative for cough and shortness of breath.   Cardiovascular: Positive for chest pain. Negative for palpitations and leg swelling.  Gastrointestinal: Negative for nausea, vomiting and abdominal pain.      Allergies  Review of patient's allergies indicates no known allergies.  Home Medications   Prior to Admission medications   Medication Sig Start Date End Date Taking? Authorizing Provider  citalopram (CELEXA) 20 MG tablet Take 1 and 1/2 tabs PO daily Patient taking differently: Take 20 mg by mouth daily.  10/02/15  Yes Thao P Le, DO  ibuprofen (ADVIL) 200 MG tablet Take 3 tablets (600 mg total) by mouth every 6 (six) hours as needed. Patient taking differently: Take 600 mg by mouth every 6 (six) hours as needed for mild pain or moderate pain.  12/15/15  Yes Huel Cote, MD  levothyroxine (SYNTHROID, LEVOTHROID) 175 MCG tablet Take 1 tablet (175 mcg total) by mouth daily before breakfast. 10/04/15  Yes Thao P Le, DO  VENTOLIN HFA 108 (90 BASE) MCG/ACT inhaler INHALE TWO PUFFS BY MOUTH EVERY FOUR HOURS AS NEEDED FOR WHEEZING OR SHORTNESS OF BREATH 10/29/15  Yes Thao P Le, DO  oxyCODONE-acetaminophen (ROXICET) 5-325 MG tablet Take 1 tablet by mouth every 4 (four) hours as needed for severe pain. Patient not taking: Reported on 05/06/2016 12/15/15   Huel Cote, MD   BP 133/90 mmHg   Pulse 78  Temp(Src) 98.3 F (36.8 C) (Oral)  Resp 15  SpO2 100%  LMP 04/29/2016   Physical Exam  Constitutional: She is oriented to person, place, and time. She appears well-developed and well-nourished. No distress.  Obese  HENT:  Head: Normocephalic and atraumatic.  Eyes: Conjunctivae are normal. Pupils are equal, round, and reactive to light. Right eye exhibits no discharge. Left eye exhibits no discharge. No scleral icterus.  Neck: Normal range of motion.  Cardiovascular: Normal rate, regular rhythm and intact distal pulses.  Exam reveals no gallop and no friction rub.   No murmur heard. Pulmonary/Chest: Effort normal and breath sounds normal. No respiratory distress. She has no wheezes. She has no rales. She exhibits tenderness.  Abdominal: Soft. Bowel sounds are normal. She exhibits no distension and no mass. There is no tenderness. There is no rebound and no guarding.  Tenderness over left side of chest wall  Neurological: She is alert and oriented to person, place, and time.  Skin: Skin is warm and dry.  Psychiatric: She has a normal mood and affect. Her behavior is normal.  Tearful    ED Course  Procedures (including critical care time) Labs Review Labs Reviewed  BASIC METABOLIC PANEL  CBC  I-STAT TROPOININ, ED  Rosezena Sensor, ED    Imaging Review Dg Chest 2 View  05/06/2016  CLINICAL DATA:  Mid to left chest pain.  Left arm numbness. EXAM: CHEST  2 VIEW COMPARISON:  05/25/2015 FINDINGS: The heart size and mediastinal contours are within normal limits. Both lungs are clear. The visualized skeletal structures are unremarkable. IMPRESSION: No active cardiopulmonary disease. Electronically Signed   By: Charlett Nose M.D.   On: 05/06/2016 12:26   I have personally reviewed and evaluated these images and lab results as part of my medical decision-making.   EKG Interpretation   Date/Time:  Monday May 06 2016 11:26:44 EDT Ventricular Rate:  89 PR Interval:  147 QRS  Duration: 90 QT Interval:  371 QTC Calculation: 451 R Axis:   80 Text Interpretation:  Sinus rhythm Baseline wander in lead(s) II III aVF  No acute changes Confirmed by LIU MD, Annabelle Harman ([id number]) on 05/06/2016 3:20:56 PM      MDM   Final diagnoses:  Chest pain, unspecified chest pain type   35 year old with atypical chest pain. Patient is afebrile, not tachycardic or tachypneic, normotensive, and not hypoxic. Chest pain work up is reassuring. EKG is NSR. CXR is negative. Troponin is 0 and repeat is 0 as well. Labs are unremarkable. No significant past or family hx of cardiac disease. Patient is non-smoker. HEART score is 0. PERC neg. Patient is advised to  f/u with Cardiology. Patient is NAD, non-toxic, with stable VS. Patient is informed of clinical course, understands medical decision making process, and agrees with plan. Opportunity for questions provided and all questions answered. Return precautions given.     Bethel Born, PA-C 05/06/16 1546  Lavera Guise, MD 05/06/16 [phone]

## 2016-05-06 NOTE — ED Notes (Signed)
Pt c/o intermittent central chest pain and left arm numbness onset last night at 2200. Not related to activity or eating. History of similar sensation that was diagnosed as musculoskeletal.

## 2016-05-07 ENCOUNTER — Telehealth: Payer: Self-pay

## 2016-05-07 DIAGNOSIS — R079 Chest pain, unspecified: Secondary | ICD-10-CM

## 2016-05-07 NOTE — Telephone Encounter (Signed)
Pt was seen in the er recently and was told to follow up with a cardiologist, but the cardiologist states she needs a referral from her pcp and she does not have one but comes here for treatment   Best number [phone]

## 2016-05-08 NOTE — Telephone Encounter (Signed)
Can we do this for pt? 

## 2016-05-09 NOTE — Telephone Encounter (Signed)
LM advising pt.  ?

## 2016-05-09 NOTE — Telephone Encounter (Signed)
Done

## 2016-05-15 ENCOUNTER — Ambulatory Visit
Admission: RE | Admit: 2016-05-15 | Discharge: 2016-05-15 | Disposition: A | Discharge status: Home / Self Care | Payer: BLUE CROSS/BLUE SHIELD | Source: Ambulatory Visit | Attending: Obstetrics and Gynecology | Admitting: Obstetrics and Gynecology

## 2016-05-15 DIAGNOSIS — N644 Mastodynia: Secondary | ICD-10-CM

## 2016-10-04 ENCOUNTER — Other Ambulatory Visit: Payer: Self-pay | Admitting: Family Medicine

## 2016-10-04 DIAGNOSIS — E038 Other specified hypothyroidism: Secondary | ICD-10-CM

## 2016-10-04 NOTE — Telephone Encounter (Signed)
Pt owes Balance and can't be seen she hasnt been here since 11-2015 she need her Synthroid refilled she can be reached at [phone]

## 2016-10-05 MED ORDER — LEVOTHYROXINE SODIUM 175 MCG PO TABS: 175.0000 ug | ORAL_TABLET | Freq: Every day | ORAL | 1 refills | Status: DC

## 2016-10-05 NOTE — Telephone Encounter (Signed)
Has not had TSH checked since 09/2015. It was in control at that time, but had been abnormal on prior readings. I can refill the medication for the next month or 2, but she needs to return to have repeat testing and discussion of that medication. That is best done when she is taking medication everyday, so make sure to return while still taking medication.

## 2016-10-05 NOTE — Telephone Encounter (Signed)
10.2016 last nl. tsh

## 2016-10-08 NOTE — Telephone Encounter (Signed)
L/m with md info

## 2016-11-06 ENCOUNTER — Other Ambulatory Visit: Payer: Self-pay

## 2016-11-06 NOTE — Telephone Encounter (Signed)
Patient is calling to see if she can get a refill for celexa. Patient states she's trying to get her balance taken care of but isn't able to at the time. For that reason she won't be able to come in for a visit. Please advise! [facility] on [address]  [phone]

## 2016-11-07 ENCOUNTER — Other Ambulatory Visit: Payer: Self-pay

## 2016-11-07 MED ORDER — CITALOPRAM HYDROBROMIDE 20 MG PO TABS
ORAL_TABLET | ORAL | 0 refills | Status: DC
Start: 2016-11-07 — End: 2018-04-16

## 2016-11-07 NOTE — Telephone Encounter (Signed)
Last ov 11/2015 She knows she needs an appt. But  But has a balance she cant pay until next month

## 2016-12-05 ENCOUNTER — Other Ambulatory Visit: Payer: Self-pay | Admitting: Family Medicine

## 2016-12-05 ENCOUNTER — Other Ambulatory Visit: Payer: Self-pay | Admitting: Physician Assistant

## 2016-12-05 DIAGNOSIS — E038 Other specified hypothyroidism: Secondary | ICD-10-CM

## 2017-04-13 ENCOUNTER — Encounter (HOSPITAL_COMMUNITY): Payer: Self-pay | Admitting: *Deleted

## 2017-04-13 ENCOUNTER — Emergency Department (HOSPITAL_COMMUNITY)
Admission: EM | Admit: 2017-04-13 | Discharge: 2017-04-13 | Disposition: A | Discharge status: Home / Self Care | Payer: BLUE CROSS/BLUE SHIELD | Attending: Emergency Medicine | Admitting: Emergency Medicine

## 2017-04-13 ENCOUNTER — Emergency Department (HOSPITAL_COMMUNITY): Payer: BLUE CROSS/BLUE SHIELD

## 2017-04-13 DIAGNOSIS — J45909 Unspecified asthma, uncomplicated: Secondary | ICD-10-CM | POA: Insufficient documentation

## 2017-04-13 DIAGNOSIS — J4541 Moderate persistent asthma with (acute) exacerbation: Secondary | ICD-10-CM | POA: Diagnosis not present

## 2017-04-13 DIAGNOSIS — E039 Hypothyroidism, unspecified: Secondary | ICD-10-CM | POA: Diagnosis not present

## 2017-04-13 DIAGNOSIS — Z79899 Other long term (current) drug therapy: Secondary | ICD-10-CM | POA: Diagnosis not present

## 2017-04-13 DIAGNOSIS — R0602 Shortness of breath: Secondary | ICD-10-CM | POA: Diagnosis not present

## 2017-04-13 DIAGNOSIS — R05 Cough: Secondary | ICD-10-CM | POA: Diagnosis not present

## 2017-04-13 MED ORDER — ALBUTEROL SULFATE HFA 108 (90 BASE) MCG/ACT IN AERS
2.0000 | INHALATION_SPRAY | Freq: Once | RESPIRATORY_TRACT | Status: AC
  Administered 2017-04-13: 2 via RESPIRATORY_TRACT

## 2017-04-13 MED ORDER — ALBUTEROL SULFATE HFA 108 (90 BASE) MCG/ACT IN AERS
INHALATION_SPRAY | RESPIRATORY_TRACT | Status: AC
  Administered 2017-04-13: 2 via RESPIRATORY_TRACT
  Filled 2017-04-13: qty 6.7

## 2017-04-13 MED ORDER — ALBUTEROL SULFATE (2.5 MG/3ML) 0.083% IN NEBU
5.0000 mg | INHALATION_SOLUTION | Freq: Once | RESPIRATORY_TRACT | Status: AC
  Administered 2017-04-13: 5 mg via RESPIRATORY_TRACT
  Filled 2017-04-13: qty 6

## 2017-04-13 MED ORDER — ALBUTEROL SULFATE (2.5 MG/3ML) 0.083% IN NEBU
2.5000 mg | INHALATION_SOLUTION | Freq: Once | RESPIRATORY_TRACT | Status: AC
  Administered 2017-04-13: 2.5 mg via RESPIRATORY_TRACT
  Filled 2017-04-13: qty 3

## 2017-04-13 MED ORDER — IPRATROPIUM-ALBUTEROL 0.5-2.5 (3) MG/3ML IN SOLN
3.0000 mL | Freq: Once | RESPIRATORY_TRACT | Status: AC
  Administered 2017-04-13: 3 mL via RESPIRATORY_TRACT
  Filled 2017-04-13: qty 3

## 2017-04-13 MED ORDER — METHYLPREDNISOLONE SODIUM SUCC 125 MG IJ SOLR
125.0000 mg | Freq: Once | INTRAMUSCULAR | Status: AC
  Administered 2017-04-13: 125 mg via INTRAVENOUS
  Filled 2017-04-13: qty 2

## 2017-04-13 MED ORDER — PREDNISONE 50 MG PO TABS: ORAL_TABLET | ORAL | 0 refills | Status: DC

## 2017-04-13 NOTE — Discharge Instructions (Signed)
Take your next dose of prednisone tomorrow morning.  Use 2 puffs of your new inhaler (throw your old one away - it is expired!) every 4 hours if you are coughing or wheezing.

## 2017-04-13 NOTE — ED Provider Notes (Signed)
[facility] Provider Note   CSN: [id number] Arrival date & time: 04/13/17  [id number]     History   Chief Complaint Chief Complaint  Patient presents with  . Shortness of Breath    HPI Julie Chambers is a 36 y.o. female with past medical history as outlined below presenting with an asthma exacerbation since yesterday.  She endorses wheezing, shortness of breath along with brown sputum production.  Additionally, she suspects she may be developing seasonal allergy issues as she has had nasal congestion with some mild bilateral facial pressure and yellow nasal discharge. She denies fevers or chills, endorses had chest pain yesterday triggered by cough only, gone today.  She denies peripheral edema, n/v or abdominal pain.  She last took her albuterol mdi last night without relief of wheezing.  The history is provided by the patient.    Past Medical History:  Diagnosis Date  . Allergy   . Anxiety   . Asthma   . Depression   . History of kidney stones   . Hypothyroidism   . 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 happened in high school and again as young adul no surgery only physical therapy  . Spontaneous abortion   . Tremor of left hand     Patient Active Problem List   Diagnosis Date Noted  . Anxiety and depression 12/10/2013  . Hypothyroidism 03/20/2013  . Asthma 03/20/2013    Past Surgical History:  Procedure Laterality Date  . LAPAROSCOPIC TUBAL LIGATION Bilateral 12/15/2015   Procedure: LAPAROSCOPIC BILATERAL TUBAL LIGATION;  Surgeon: Huel CoteKathy Richardson, MD;  Location: [facility];  Service: Gynecology;  Laterality: Bilateral;  . LITHOTRIPSY    . MOLE REMOVAL      OB History    Gravida Para Term Preterm AB Living   3 2 2   1 2    SAB TAB Ectopic Multiple Live Births   1 0     2       Home Medications    Prior to Admission medications   Medication Sig Start Date End Date Taking? Authorizing Provider  citalopram (CELEXA) 20 MG tablet Take 1 and  1/2 tabs PO daily Patient taking differently: Take 20 mg by mouth daily.  11/07/16  Yes Weber, Sarah L, PA-C  ibuprofen (ADVIL) 200 MG tablet Take 3 tablets (600 mg total) by mouth every 6 (six) hours as needed. Patient taking differently: Take 400 mg by mouth every 6 (six) hours as needed for mild pain or moderate pain.  12/15/15  Yes Huel Coteichardson, Kathy, MD  levothyroxine (SYNTHROID, LEVOTHROID) 175 MCG tablet Take 1 tablet (175 mcg total) by mouth daily before breakfast. 10/05/16  Yes Shade FloodGreene, Jeffrey R, MD  VENTOLIN HFA 108 (90 BASE) MCG/ACT inhaler INHALE TWO PUFFS BY MOUTH EVERY FOUR HOURS AS NEEDED FOR WHEEZING OR SHORTNESS OF BREATH 10/29/15  Yes Le, Thao P, DO  predniSONE (DELTASONE) 50 MG tablet Take one tablet daily for 4 days, next dose tomorrow morning 04/13/17   Burgess AmorIdol, Ady Heimann, PA-C    Family History Family History  Problem Relation Age of Onset  . Depression Mother   . Cancer Father        non hodgkins lymphoma  . Mental illness Sister        ocd  . Cancer Brother        neuroblastoma  . Diabetes Maternal Grandmother   . COPD Maternal Grandmother   . Hypertension Maternal Grandmother   . Anesthesia problems Neg Hx   .  Hypotension Neg Hx   . Pseudochol deficiency Neg Hx   . Malignant hyperthermia Neg Hx     Social History Social History  Substance Use Topics  . Smoking status: Never Smoker  . Smokeless tobacco: Never Used  . Alcohol use 0.0 oz/week     Comment: occasionally      Allergies   Patient has no known allergies.   Review of Systems Review of Systems  Constitutional: Negative for chills and fever.  HENT: Positive for congestion, rhinorrhea and sinus pressure. Negative for sore throat.   Eyes: Negative.   Respiratory: Positive for chest tightness, shortness of breath and wheezing.   Cardiovascular: Negative for palpitations and leg swelling.  Gastrointestinal: Negative for abdominal pain, nausea and vomiting.  Genitourinary: Negative.   Musculoskeletal:  Negative for arthralgias, joint swelling and neck pain.  Skin: Negative.  Negative for rash and wound.  Neurological: Negative for dizziness, weakness, light-headedness, numbness and headaches.  Psychiatric/Behavioral: Negative.      Physical Exam Updated Vital Signs BP 121/72 (BP Location: Left Arm)   Pulse (!) 118   Temp 98.2 F (36.8 C) (Oral)   Resp 18   Ht 5\' 5"  (1.651 m)   Wt 122.5 kg   LMP 03/30/2017   SpO2 94%   BMI 44.93 kg/m   Physical Exam  Constitutional: She appears well-developed and well-nourished.  HENT:  Head: Normocephalic and atraumatic.  Nose: Mucosal edema present. Right sinus exhibits maxillary sinus tenderness. Left sinus exhibits maxillary sinus tenderness.  Eyes: Conjunctivae are normal.  Neck: Normal range of motion.  Cardiovascular: Regular rhythm, normal heart sounds and intact distal pulses.  Tachycardia present.   Pulmonary/Chest: Effort normal. She has decreased breath sounds. She has wheezes. She has no rhonchi. She has no rales. She exhibits no retraction.  Prolonged expirations.  Abdominal: Soft. Bowel sounds are normal. There is no tenderness.  Musculoskeletal: Normal range of motion.  Neurological: She is alert.  Skin: Skin is warm and dry.  Psychiatric: She has a normal mood and affect.  Nursing note and vitals reviewed.    ED Treatments / Results  Labs (all labs ordered are listed, but only abnormal results are displayed) Labs Reviewed - No data to display  EKG  ED ECG REPORT   Date: 04/13/2017  Rate: 107  Rhythm: sinus tachycardia  QRS Axis: normal  Intervals: normal  ST/T Wave abnormalities: normal  Conduction Disutrbances:none  Narrative Interpretation: unchanged except for rate  Old EKG Reviewed: changes noted  I have personally reviewed the EKG tracing and agree with the computerized printout as noted.   Radiology Dg Chest 2 View  Result Date: 04/13/2017 CLINICAL DATA:  Shortness of breath since yesterday.  Brown productive cough, sinus pressure. Wheezing. History of asthma. EXAM: CHEST  2 VIEW COMPARISON:  Chest x-rays dated 05/06/2016 and 05/25/2015. FINDINGS: Heart size and mediastinal contours are normal. Lungs are clear. Lung volumes are normal. No pleural effusion or pneumothorax seen. Osseous structures about the chest are unremarkable. IMPRESSION: Normal chest x-ray.  No pneumonia or pulmonary edema. Electronically Signed   By: Bary Richard M.D.   On: 04/13/2017 09:17    Procedures Procedures (including critical care time)  Medications Ordered in ED Medications  albuterol (PROVENTIL) (2.5 MG/3ML) 0.083% nebulizer solution 2.5 mg (2.5 mg Nebulization Given 04/13/17 0948)  methylPREDNISolone sodium succinate (SOLU-MEDROL) 125 mg/2 mL injection 125 mg (125 mg Intravenous Given 04/13/17 0916)  ipratropium-albuterol (DUONEB) 0.5-2.5 (3) MG/3ML nebulizer solution 3 mL (3 mLs Nebulization Given 04/13/17  [phone])  albuterol (PROVENTIL) (2.5 MG/3ML) 0.083% nebulizer solution 5 mg (5 mg Nebulization Given 04/13/17 1117)  albuterol (PROVENTIL HFA;VENTOLIN HFA) 108 (90 Base) MCG/ACT inhaler (  Handoff 04/13/17 1123)  albuterol (PROVENTIL HFA;VENTOLIN HFA) 108 (90 Base) MCG/ACT inhaler 2 puff (2 puffs Inhalation Given 04/13/17 1128)     Initial Impression / Assessment and Plan / ED Course  I have reviewed the triage vital signs and the nursing notes.  Pertinent labs & imaging results that were available during my care of the patient were reviewed by me and considered in my medical decision making (see chart for details).     PT given solumedrol here, albuterol/atrovent x 1 with no significant improvement in wheezing.  Albuterol 5 mg repeated after which lungs clear, no wheeze or prolonged expirations.  She felt improved, but jittery.  Ambulated without desaturation.  She was placed on a prednisone pulse dose 50 mg qd x 4 days, replaced her mdi (she presents with an inhaler that expired 11/16).  Prn f/u  anticipated, discussed return precautions.    Final Clinical Impressions(s) / ED Diagnoses   Final diagnoses:  Moderate persistent asthma with exacerbation    New Prescriptions Discharge Medication List as of 04/13/2017 12:19 PM    START taking these medications   Details  predniSONE (DELTASONE) 50 MG tablet Take one tablet daily for 4 days, next dose tomorrow morning, Print         Burgess Amor, PA-C 04/13/17 1423    Mesner, Barbara Cower, MD 04/13/17 1705

## 2017-04-13 NOTE — ED Triage Notes (Addendum)
Pt c/o SOB since yesterday morning.  Pt also c/o brown productive cough, sinus pressure. Pt took Benadryl last night with some relief. Pt wheezing in triage. Ambulatory with no distress to ED room. Denies fever.

## 2017-04-13 NOTE — ED Notes (Signed)
Ambulated pt. O2 between %95-%99. Pt pulse rose to 150s-160s while ambulating as well.

## 2017-08-05 ENCOUNTER — Ambulatory Visit: Payer: BLUE CROSS/BLUE SHIELD | Admitting: Family Medicine

## 2018-03-12 ENCOUNTER — Ambulatory Visit: Payer: BLUE CROSS/BLUE SHIELD | Admitting: Family Medicine

## 2018-03-30 DIAGNOSIS — R5383 Other fatigue: Secondary | ICD-10-CM | POA: Diagnosis not present

## 2018-03-30 DIAGNOSIS — L237 Allergic contact dermatitis due to plants, except food: Secondary | ICD-10-CM | POA: Diagnosis not present

## 2018-03-30 DIAGNOSIS — Z0000 Encounter for general adult medical examination without abnormal findings: Secondary | ICD-10-CM | POA: Diagnosis not present

## 2018-03-30 DIAGNOSIS — E559 Vitamin D deficiency, unspecified: Secondary | ICD-10-CM | POA: Diagnosis not present

## 2018-04-07 DIAGNOSIS — Z130 Encounter for screening for diseases of the blood and blood-forming organs and certain disorders involving the immune mechanism: Secondary | ICD-10-CM | POA: Diagnosis not present

## 2018-04-07 DIAGNOSIS — Z01419 Encounter for gynecological examination (general) (routine) without abnormal findings: Secondary | ICD-10-CM | POA: Diagnosis not present

## 2018-04-07 DIAGNOSIS — Z124 Encounter for screening for malignant neoplasm of cervix: Secondary | ICD-10-CM | POA: Diagnosis not present

## 2018-04-07 DIAGNOSIS — Z1389 Encounter for screening for other disorder: Secondary | ICD-10-CM | POA: Diagnosis not present

## 2018-04-07 DIAGNOSIS — Z6841 Body Mass Index (BMI) 40.0 and over, adult: Secondary | ICD-10-CM | POA: Diagnosis not present

## 2018-04-10 ENCOUNTER — Telehealth: Payer: Self-pay | Admitting: Family Medicine

## 2018-04-10 NOTE — Telephone Encounter (Signed)
Called and lmom that she NO SHOW for Dr Delton See new appt and that makes her no able to schedule appt at this office.  No answer on phone lmom.

## 2018-04-16 ENCOUNTER — Ambulatory Visit: Payer: BLUE CROSS/BLUE SHIELD | Admitting: Family Medicine

## 2018-04-16 ENCOUNTER — Ambulatory Visit (INDEPENDENT_AMBULATORY_CARE_PROVIDER_SITE_OTHER): Payer: BLUE CROSS/BLUE SHIELD | Admitting: Family Medicine

## 2018-04-16 ENCOUNTER — Encounter: Payer: Self-pay | Admitting: Family Medicine

## 2018-04-16 ENCOUNTER — Other Ambulatory Visit: Payer: Self-pay

## 2018-04-16 DIAGNOSIS — J020 Streptococcal pharyngitis: Secondary | ICD-10-CM

## 2018-04-16 DIAGNOSIS — L989 Disorder of the skin and subcutaneous tissue, unspecified: Secondary | ICD-10-CM

## 2018-04-16 DIAGNOSIS — F419 Anxiety disorder, unspecified: Secondary | ICD-10-CM

## 2018-04-16 DIAGNOSIS — F329 Major depressive disorder, single episode, unspecified: Secondary | ICD-10-CM

## 2018-04-16 DIAGNOSIS — E039 Hypothyroidism, unspecified: Secondary | ICD-10-CM

## 2018-04-16 DIAGNOSIS — F32A Depression, unspecified: Secondary | ICD-10-CM

## 2018-04-16 DIAGNOSIS — R7303 Prediabetes: Secondary | ICD-10-CM

## 2018-04-16 LAB — POCT RAPID STREP A (OFFICE): Rapid Strep A Screen: POSITIVE — AB

## 2018-04-16 MED ORDER — AZITHROMYCIN 250 MG PO TABS: ORAL_TABLET | ORAL | 0 refills | Status: DC

## 2018-04-16 MED ORDER — FLUOXETINE HCL 20 MG PO TABS: 20.0000 mg | ORAL_TABLET | Freq: Every day | ORAL | 3 refills | Status: DC

## 2018-04-16 NOTE — Progress Notes (Signed)
Patient ID: Julie Chambers, female    DOB: 1981/03/25, 37 y.o.   MRN: [id number]  Chief Complaint  Patient presents with  . Establish Care  . Sore Throat    started yesterday  . Nausea  . Obesity    wants to discuss weight loss options    Allergies Patient has no known allergies.  Subjective:   Julie Chambers is a 36 y.o. female who presents to [facility] today.  HPI Julie Chambers presents today as a new patient visit to establish care.  She reports that she has a history of hypothyroidism, anxiety/depression, and has gained weight over the past several years.  She reports that she has been on Celexa for the past several years and since she has been on this medication is probably gained over 30 pounds.  She reports that she was initially placed on this medication for anxiety and depression.  She reports that she did feel that it is helped her mood.  She reports that she does not have any suicidal or homicidal ideations.  Her energy level can be fair to low.  She sleeps pretty good.  She is busy with her job, family, and children.  She enjoys Architect.  She believes that being on the medication helps her to be less irritable.  She does report she has had a sore throat for the past couple days.  She has been around a coworker that was sick.  She reports that hurts to swallow.  She has not had a cough.  Reports the glands in her neck are little sore.  Has had questionable fever.  Denies any nausea, vomiting, diarrhea.  No dysuria.  No abdominal pain.  Denies any shortness of breath or skin rashes.  No neck stiffness.  No ear pain.  He is interested in losing weight.  Does not always eat a healthy diet.  Does not exercise. Reports that she has been compliant with her thyroid medicine.  Reports she has been hypothyroid since she was 37 years old.  Has been on medication since that time.  This is the highest dose of thyroid medication she is ever been on.  Has had  pre-cancerous skin moles in the past and has not followed up with dermatology in over a year. Needs referral.    Past Medical History:  Diagnosis Date  . Allergy   . Anxiety   . Asthma   . Depression   . History of kidney stones   . Hypothyroidism   . 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 happened in high school and again as young adul no surgery only physical therapy  . Spontaneous abortion   . Tremor of left hand     Past Surgical History:  Procedure Laterality Date  . LAPAROSCOPIC TUBAL LIGATION Bilateral 12/15/2015   Procedure: LAPAROSCOPIC BILATERAL TUBAL LIGATION;  Surgeon: Huel Cote, MD;  Location: [facility];  Service: Gynecology;  Laterality: Bilateral;  . LITHOTRIPSY    . MOLE REMOVAL     skin precancer    Family History  Problem Relation Age of Onset  . Depression Mother   . Cancer Father        non hodgkins lymphoma  . Mental illness Sister        ocd  . Cancer Brother        neuroblastoma  . Diabetes Maternal Grandmother   . COPD Maternal Grandmother   . Hypertension Maternal Grandmother   . Anesthesia  problems Neg Hx   . Hypotension Neg Hx   . Pseudochol deficiency Neg Hx   . Malignant hyperthermia Neg Hx      Social History   Socioeconomic History  . Marital status: Married    Spouse name: Not on file  . Number of children: 2  . Years of education: Not on file  . Highest education level: Not on file  Occupational History  . Not on file  Social Needs  . Financial resource strain: Not hard at all  . Food insecurity:    Worry: Sometimes true    Inability: Sometimes true  . Transportation needs:    Medical: No    Non-medical: No  Tobacco Use  . Smoking status: Never Smoker  . Smokeless tobacco: Never Used  Substance and Sexual Activity  . Alcohol use: Yes    Alcohol/week: 0.0 oz    Comment: occasionally   . Drug use: No  . Sexual activity: Yes    Partners: Male    Birth control/protection: Condom  Lifestyle  .  Physical activity:    Days per week: 0 days    Minutes per session: 0 min  . Stress: To some extent  Relationships  . Social connections:    Talks on phone: More than three times a week    Gets together: Once a week    Attends religious service: Never    Active member of club or organization: No    Attends meetings of clubs or organizations: Never    Relationship status: Married  Other Topics Concern  . 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   Married.    Has two children 54/56 years old.   Eats all food groups.   Enjoys Architect.    Wears seatbelt.   No exercise.    Review of Systems  Constitutional: Positive for fatigue and unexpected weight change. Negative for activity change, appetite change, chills, diaphoresis and fever.  HENT: Positive for sore throat. Negative for congestion, ear pain, mouth sores, nosebleeds, postnasal drip, rhinorrhea, sinus pressure, sinus pain, sneezing, tinnitus, trouble swallowing and voice change.   Eyes: Negative for visual disturbance.  Respiratory: Negative for cough, chest tightness and shortness of breath.   Cardiovascular: Negative for chest pain, palpitations and leg swelling.  Gastrointestinal: Negative for abdominal pain, nausea and vomiting.  Genitourinary: Negative for dysuria, frequency and urgency.  Skin: Negative for rash.  Neurological: Negative for dizziness, tremors, syncope and light-headedness.  Hematological: Negative for adenopathy. Does not bruise/bleed easily.  Psychiatric/Behavioral: Negative for agitation, dysphoric mood, self-injury, sleep disturbance and suicidal ideas. The patient is not nervous/anxious.        Feels like the celexa helps with anxiety and mood. Has had a low sex drive since starting medication.    Depression screen [facility] 2/9 04/16/2018 11/11/2015 10/02/2015 03/29/2015  Decreased Interest 0 0 1 0  Down, Depressed, Hopeless 0 0 1 1  PHQ -  2 Score 0 0 2 1  Altered sleeping - - 3 -  Tired, decreased energy - - 3 -  Change in appetite - - 1 -  Feeling bad or failure about yourself  - - 1 -  Trouble concentrating - - 0 -  Moving slowly or fidgety/restless - - 0 -  Suicidal thoughts - - 0 -  PHQ-9 Score - - 10 -    Objective:   BP 126/84 (BP Location: Left Arm, Patient Position:  Sitting, Cuff Size: Large)   Pulse 100   Temp 99.4 F (37.4 C) (Oral)   Resp 18   Ht  (1.651 m)   Wt 277 lb 0.6 oz (125.7 kg)   SpO2 96%   BMI 46.10 kg/m   Physical Exam  Constitutional: She is oriented to person, place, and time. She appears well-developed and well-nourished.  Non-toxic appearance. She does not appear ill.  HENT:  Head: Normocephalic and atraumatic.  Right Ear: Ear canal normal. No tenderness.  Left Ear: Ear canal normal. No tenderness.  Mouth/Throat: Uvula is midline and mucous membranes are normal. No oral lesions. No uvula swelling. Posterior oropharyngeal erythema present. No posterior oropharyngeal edema or tonsillar abscesses.  Eyes: Pupils are equal, round, and reactive to light. EOM are normal.  Neck: Normal range of motion. Neck supple. No thyromegaly present.  Cardiovascular: Normal rate and regular rhythm.  No murmur heard. Pulmonary/Chest: Effort normal and breath sounds normal. No respiratory distress. She has no wheezes.  Abdominal: Soft. Bowel sounds are normal. She exhibits no distension. There is no tenderness.  Neurological: She is alert and oriented to person, place, and time.  Skin: Skin is warm and dry. Capillary refill takes less than 2 seconds.  Psychiatric: She has a normal mood and affect. Her behavior is normal.     Assessment and Plan  1. Hypothyroidism, unspecified type Patient with no known etiology of her thyroid disorder.  Suspect Hashimoto's.  Will check labs and increase dose as needed. - Thyroid peroxidase antibody - TSH  2. Skin lesion History of precancerous skin lesion. -  Ambulatory referral to Dermatology  3. Prediabetes/obesity The patient is asked to make an attempt to improve diet and exercise patterns to aid in medical management of this problem. Check labs today. - Basic metabolic panel - Hemoglobin A1c Diet and exercise recommended.  Recommended for patient to keep dietary log and will review at next visit.  Consider metformin if needed to aid with blood sugar control and weight loss 4. Anxiety and depression Had long discussion with patient today regarding benefits of SSRIs.  We also discussed possible side effects of SSRI including decreased sex drive, weight gain, nausea.  I do believe that some of her weight gain could be secondary to the citalopram.  Will DC citalopram at this time.  She is only currently on a 10 mg dose.  We will start fluoxetine 20 mg p.o. Daily. Suicide risks evaluated and documented in note if present or in the area below.  Patient has protective factors of family and community support.  Patient reports that family believes is behaving rationally. Patient displays problem solving skills.   Patient specifically denies suicide ideation. Patient has access/information to healthcare contacts if situation or mood changes where patient is a risk to self or others or mood becomes unstable.   During the encounter, the patient had good eye contact and firm handshake regarding safety contract and agreement to seek help if mood worsens and not to harm self.   Patient understands the treatment plan and is in agreement. Agrees to keep follow up and call prior or return to clinic if needed.   - FLUoxetine (PROZAC) 20 MG tablet; Take 1 tablet (20 mg total) by mouth daily.  Dispense: 30 tablet; Refill: 3  5. Strep pharyngitis Counseled regarding worrisome signs and symptoms of infection/sore throat.  Take medication as directed.  Supportive care discussed with patient. - azithromycin (ZITHROMAX) 250 MG tablet; Take two pills today and  one  pill a day for the next four days.  Dispense: 6 tablet; Refill: 0 - POCT rapid strep A  Office visit was greater than 45 minutes.  Greater than 50% spent counseling and coordinating care.  Patient was counseled on the above aforementioned information. Return in about 3 weeks (around 05/07/2018). Aliene Beams, MD 04/16/2018

## 2018-04-16 NOTE — Patient Instructions (Signed)
Request records from [facility] in [place] on [address]. Just had labs.

## 2018-04-17 LAB — BASIC METABOLIC PANEL
BUN: 12 mg/dL (ref 7–25)
CHLORIDE: 101 mmol/L (ref 98–110)
CO2: 29 mmol/L (ref 20–32)
CREATININE: 0.58 mg/dL (ref 0.50–1.10)
Calcium: 9.1 mg/dL (ref 8.6–10.2)
Glucose, Bld: 89 mg/dL (ref 65–139)
POTASSIUM: 3.8 mmol/L (ref 3.5–5.3)
SODIUM: 138 mmol/L (ref 135–146)

## 2018-04-17 LAB — HEMOGLOBIN A1C
Hgb A1c MFr Bld: 5.4 % of total Hgb (ref <5.7)
Mean Plasma Glucose: 108 (calc)
eAG (mmol/L): 6 (calc)

## 2018-04-17 LAB — THYROID PEROXIDASE ANTIBODY: THYROID PEROXIDASE ANTIBODY: 7 [IU]/mL (ref <9)

## 2018-04-17 LAB — TSH: TSH: 0.68 mIU/L

## 2018-04-22 ENCOUNTER — Encounter: Payer: Self-pay | Admitting: Family Medicine

## 2018-05-07 ENCOUNTER — Other Ambulatory Visit: Payer: Self-pay

## 2018-05-07 ENCOUNTER — Ambulatory Visit: Payer: BLUE CROSS/BLUE SHIELD | Admitting: Family Medicine

## 2018-05-07 ENCOUNTER — Encounter: Payer: Self-pay | Admitting: Family Medicine

## 2018-05-07 VITALS — BP 126/80 | HR 91 | Temp 98.7°F | Resp 14 | Ht 65.0 in | Wt 279.0 lb

## 2018-05-07 DIAGNOSIS — F419 Anxiety disorder, unspecified: Secondary | ICD-10-CM

## 2018-05-07 DIAGNOSIS — F329 Major depressive disorder, single episode, unspecified: Secondary | ICD-10-CM

## 2018-05-07 NOTE — Progress Notes (Signed)
Patient ID: Julie Chambers, female    DOB: Nov 12, 1981, 37 y.o.   MRN: [id number]  Chief Complaint  Patient presents with  . Obesity    follow up    Allergies Patient has no known allergies.  Subjective:   Julie Chambers is a 37 y.o. female who presents to [facility] today.  HPI Julie Chambers presents today for follow-up.  At her last visit her SSRI was changed from citalopram to Prozac.  This change was made secondary to significant weight gain while being on the citalopram.  She reports that she has been doing well on the Prozac.  She has not had any side effects after the first week.  She reports that she needs to take it in the morning or cause her to wake up frequently during the night.  She reports that her mood is about the same as it was on the citalopram.  She is only been on the Prozac for 2 weeks now.  She does not feel down or depressed.  Her energy level is fairly good.  She did get over the sore throat and strep throat.  She completed all the antibiotic. She reports that she does not want to have any weight loss surgery.  She also does not want to take any medications which increase her cardiovascular risk.  She reports that she has had disordered eating for very long time.  She reports that she stress eats and does binge eat.  She would be interested in seeing a specialist/therapist to help.  She reports she does not feel she needs to see a nutritionist.  She reports that she knows that healthy food she needs to eat but does not do it.   Past Medical History:  Diagnosis Date  . Allergy   . Anxiety   . Asthma   . Depression   . History of kidney stones   . Hypothyroidism   . 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 happened in high school and again as young adul no surgery only physical therapy  . Spontaneous abortion   . Tremor of left hand     Past Surgical History:  Procedure Laterality Date  . LAPAROSCOPIC TUBAL LIGATION Bilateral 12/15/2015    Procedure: LAPAROSCOPIC BILATERAL TUBAL LIGATION;  Surgeon: Huel Cote, MD;  Location: [facility];  Service: Gynecology;  Laterality: Bilateral;  . LITHOTRIPSY    . MOLE REMOVAL     skin precancer    Family History  Problem Relation Age of Onset  . Depression Mother   . Cancer Father        non hodgkins lymphoma  . Mental illness Sister        ocd  . Cancer Brother        neuroblastoma  . Diabetes Maternal Grandmother   . COPD Maternal Grandmother   . Hypertension Maternal Grandmother   . Anesthesia problems Neg Hx   . Hypotension Neg Hx   . Pseudochol deficiency Neg Hx   . Malignant hyperthermia Neg Hx      Social History   Socioeconomic History  . Marital status: Married    Spouse name: Not on file  . Number of children: 2  . Years of education: Not on file  . Highest education level: Not on file  Occupational History  . Not on file  Social Needs  . Financial resource strain: Not hard at all  . Food insecurity:    Worry: Sometimes true  Inability: Sometimes true  . Transportation needs:    Medical: No    Non-medical: No  Tobacco Use  . Smoking status: Never Smoker  . Smokeless tobacco: Never Used  Substance and Sexual Activity  . Alcohol use: Yes    Alcohol/week: 0.0 oz    Comment: occasionally   . Drug use: No  . Sexual activity: Yes    Partners: Male    Birth control/protection: Condom  Lifestyle  . Physical activity:    Days per week: 0 days    Minutes per session: 0 min  . Stress: To some extent  Relationships  . Social connections:    Talks on phone: More than three times a week    Gets together: Once a week    Attends religious service: Never    Active member of club or organization: No    Attends meetings of clubs or organizations: Never    Relationship status: Married  Other Topics Concern  . 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   Married.    Has two children 12/6 years  old.   Eats all food groups.   Enjoys Architect.    Wears seatbelt.   No exercise.   Current Outpatient Medications on File Prior to Visit  Medication Sig Dispense Refill  . FLUoxetine (PROZAC) 20 MG tablet Take 1 tablet (20 mg total) by mouth daily. 30 tablet 3  . ibuprofen (ADVIL) 200 MG tablet Take 3 tablets (600 mg total) by mouth every 6 (six) hours as needed. (Patient taking differently: Take 400 mg by mouth every 6 (six) hours as needed for mild pain or moderate pain. ) 30 tablet 0  . levothyroxine (SYNTHROID, LEVOTHROID) 175 MCG tablet Take 1 tablet (175 mcg total) by mouth daily before breakfast. 30 tablet 1  . VENTOLIN HFA 108 (90 BASE) MCG/ACT inhaler INHALE TWO PUFFS BY MOUTH EVERY FOUR HOURS AS NEEDED FOR WHEEZING OR SHORTNESS OF BREATH 18 Inhaler 0   No current facility-administered medications on file prior to visit.     Review of Systems  Constitutional: Negative for activity change, appetite change and fever.  Eyes: Negative for visual disturbance.  Respiratory: Negative for cough, chest tightness and shortness of breath.   Cardiovascular: Negative for chest pain, palpitations and leg swelling.  Gastrointestinal: Negative for abdominal pain, nausea and vomiting.  Genitourinary: Negative for dysuria, frequency and urgency.  Skin: Negative for rash.  Neurological: Negative for dizziness, syncope and light-headedness.  Hematological: Negative for adenopathy.  Psychiatric/Behavioral: Negative for behavioral problems, decreased concentration, dysphoric mood, hallucinations and sleep disturbance. The patient is not nervous/anxious and is not hyperactive.      Objective:   BP 126/80 (BP Location: Left Arm, Patient Position: Sitting, Cuff Size: Large)   Pulse 91   Temp 98.7 F (37.1 C) (Temporal)   Resp 14   Ht 5\' 5"  (1.651 m)   Wt 279 lb 0.6 oz (126.6 kg)   SpO2 95%   BMI 46.43 kg/m   Physical Exam  Constitutional: She appears well-developed and  well-nourished.  Cardiovascular: Normal rate, regular rhythm and normal heart sounds.  Pulmonary/Chest: Effort normal and breath sounds normal.  Skin: Skin is warm and dry. Capillary refill takes less than 2 seconds.  Psychiatric: She has a normal mood and affect. Her behavior is normal. Judgment and thought content normal.  Vitals reviewed.   Depression screen [facility] 2/9 05/07/2018 04/16/2018 11/11/2015 10/02/2015 03/29/2015  Decreased  Interest 0 0 0 1 0  Down, Depressed, Hopeless 1 0 0 1 1  PHQ - 2 Score 1 0 0 2 1  Altered sleeping - - - 3 -  Tired, decreased energy - - - 3 -  Change in appetite - - - 1 -  Feeling bad or failure about yourself  - - - 1 -  Trouble concentrating - - - 0 -  Moving slowly or fidgety/restless - - - 0 -  Suicidal thoughts - - - 0 -  PHQ-9 Score - - - 10 -    Assessment and Plan  1. Anxiety and depression Symptoms are stable on Prozac.  Continue medication as directed.  She is not having any side effects on medication.  We did talk about increasing the dose in 6 weeks if needed.  She will call prior to her visit if she her mood is not doing well and at that point will increase dose prior to her visit.  She was also told to call with any questions or concerns.  She denies any suicidal ideations. Suicide risks evaluated and documented in note if present or in the area below.  Patient has protective factors of family and community support.  Patient reports that family believes is behaving rationally. Patient displays problem solving skills.   Patient specifically denies suicide ideation. Patient has access/information to healthcare contacts if situation or mood changes where patient is a risk to self or others or mood becomes unstable.   During the encounter, the patient had good eye contact and firm handshake regarding safety contract and agreement to seek help if mood worsens and not to harm self.   Patient understands the treatment plan and is in agreement.  Agrees to keep follow up and call prior or return to clinic if needed.    2. Morbid obesity (HCC) Patient has motivated behavior to make changes in her lifestyle to help with losing weight.  She also believes that she has some disordered eating/binge eating/stress eating which would be best managed by seeing an eating disorder specialist.  We discussed these options today.  She is going to call her insurance regarding coverage.  Several resources for therapist were discussed with patient and she is going to follow-up with these.  She will call me or send me a my chart message letting me know when her appointment is.  Discussed patient's lab work today.  All her labs were within normal limits.  She will follow-up in 2 months or sooner if needed.  If she has trouble getting an appointment with an eating disorder specialist and she will follow-up or call.  No follow-ups on file. Aliene Beamsachel Major [place], MD 05/07/2018

## 2018-05-07 NOTE — Patient Instructions (Signed)
Call me in 6 weeks you want to increase the dose of prozac before you follow up.

## 2018-05-08 ENCOUNTER — Encounter: Payer: Self-pay | Admitting: Family Medicine

## 2018-05-21 DIAGNOSIS — N920 Excessive and frequent menstruation with regular cycle: Secondary | ICD-10-CM | POA: Diagnosis not present

## 2018-05-21 DIAGNOSIS — N946 Dysmenorrhea, unspecified: Secondary | ICD-10-CM | POA: Diagnosis not present

## 2018-06-09 ENCOUNTER — Encounter: Payer: Self-pay | Admitting: Family Medicine

## 2018-07-07 ENCOUNTER — Ambulatory Visit (INDEPENDENT_AMBULATORY_CARE_PROVIDER_SITE_OTHER): Payer: BLUE CROSS/BLUE SHIELD | Admitting: Family Medicine

## 2018-07-07 ENCOUNTER — Other Ambulatory Visit: Payer: Self-pay

## 2018-07-07 ENCOUNTER — Encounter: Payer: Self-pay | Admitting: Family Medicine

## 2018-07-07 VITALS — BP 110/72 | HR 94 | Temp 98.6°F | Resp 12 | Ht 65.0 in | Wt 277.0 lb

## 2018-07-07 DIAGNOSIS — F419 Anxiety disorder, unspecified: Secondary | ICD-10-CM

## 2018-07-07 DIAGNOSIS — F329 Major depressive disorder, single episode, unspecified: Secondary | ICD-10-CM | POA: Diagnosis not present

## 2018-07-07 DIAGNOSIS — E039 Hypothyroidism, unspecified: Secondary | ICD-10-CM | POA: Diagnosis not present

## 2018-07-07 MED ORDER — CITALOPRAM HYDROBROMIDE 20 MG PO TABS: 30.0000 mg | ORAL_TABLET | Freq: Every day | ORAL | 0 refills | Status: DC

## 2018-07-07 NOTE — Progress Notes (Signed)
Patient ID: Julie Chambers, female    DOB: 19-Oct-1981, 37 y.o.   MRN: [id number]  Chief Complaint  Patient presents with  . Medication Management    Allergies Patient has no known allergies.  Subjective:   Julie Chambers is a 37 y.o. female who presents to [facility] today.  HPI Here for follow-up office visit.  Reports that she is doing very well.  She reports that she ran out of the Prozac and did not get it refilled.  She did have some Celexa at home so she restarted her Celexa.  She only took the Prozac for 1 month.  She reports that it did cause her to have some looser stools and she did not like that.  She reports that she started back on the Celexa and feels good.  She would like to continue this medication.  She believes that it was not the medication that caused her weight gain but rather her dietary patterns and her lack of exercise.  She is excited that she will be starting new job on the 19th. Job is going to be in [place] and then transfering to [employer].  She reports that this will be a much better job for her family because she will be making a good bit more money and believes this is a positive change.  She reports that she did not see Julie Chambers at at the counseling center because it was not covered by her insurance.  She reports that the weight loss counseling center/center to deal with overeating did give her some names of places that would accept her insurance. Still plan on going to the counseling but needs to wait for her job change. Is exercising and walking with dogs.  Has started fostering dogs and volunteering.  Feels has a purpose and is giving back with the dog fostering program.  Is doing better with relationship with husband.  Was previously on 30 mg of Celexa.  Reports she is only been taking 20 mg a day.  Reports that she has been having some difficulty falling asleep at night because she feels like her mind is thinking about all the things in her  life and lays awake in bed thinking.  Reports that these are good changes but it still can cause her a little bit of anxiety regarding the changes.  Is also taking her thyroid medication each day.  Is not having any side effects or problems with the medication.    Past Medical History:  Diagnosis Date  . Allergy   . Anxiety   . Asthma   . Depression   . History of kidney stones   . Hypothyroidism   . 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 happened in high school and again as young adul no surgery only physical therapy  . Spontaneous abortion   . Tremor of left hand     Past Surgical History:  Procedure Laterality Date  . LAPAROSCOPIC TUBAL LIGATION Bilateral 12/15/2015   Procedure: LAPAROSCOPIC BILATERAL TUBAL LIGATION;  Surgeon: Huel Cote, MD;  Location: [facility];  Service: Gynecology;  Laterality: Bilateral;  . LITHOTRIPSY    . MOLE REMOVAL     skin precancer    Family History  Problem Relation Age of Onset  . Depression Mother   . Cancer Father        non hodgkins lymphoma  . Mental illness Sister        ocd  . Cancer Brother  neuroblastoma  . Diabetes Maternal Grandmother   . COPD Maternal Grandmother   . Hypertension Maternal Grandmother   . Anesthesia problems Neg Hx   . Hypotension Neg Hx   . Pseudochol deficiency Neg Hx   . Malignant hyperthermia Neg Hx      Social History   Socioeconomic History  . Marital status: Married    Spouse name: Not on file  . Number of children: 2  . Years of education: Not on file  . Highest education level: Not on file  Occupational History  . Not on file  Social Needs  . Financial resource strain: Not hard at all  . Food insecurity:    Worry: Sometimes true    Inability: Sometimes true  . Transportation needs:    Medical: No    Non-medical: No  Tobacco Use  . Smoking status: Never Smoker  . Smokeless tobacco: Never Used  Substance and Sexual Activity  . Alcohol use: Yes     Alcohol/week: 0.0 oz    Comment: occasionally   . Drug use: No  . Sexual activity: Yes    Partners: Male    Birth control/protection: Condom  Lifestyle  . Physical activity:    Days per week: 0 days    Minutes per session: 0 min  . Stress: To some extent  Relationships  . Social connections:    Talks on phone: More than three times a week    Gets together: Once a week    Attends religious service: Never    Active member of club or organization: No    Attends meetings of clubs or organizations: Never    Relationship status: Married  Other Topics Concern  . 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   Married.    Has two children 82/15 years old.   Eats all food groups.   Enjoys Architect.    Wears seatbelt.   No exercise.   Has been fostering dogs and is adopting one of the dogs.     Review of Systems  Constitutional: Negative for activity change, appetite change, fatigue, fever and unexpected weight change.  Eyes: Negative for visual disturbance.  Respiratory: Negative for cough, chest tightness and shortness of breath.   Cardiovascular: Negative for chest pain, palpitations and leg swelling.  Gastrointestinal: Negative for abdominal pain, diarrhea, nausea and vomiting.       Reports that since discontinuing the Prozac that her bowel movements are back to normal.  She is not having any diarrhea or loose stools.  Neurological: Negative for dizziness, syncope and light-headedness.  Hematological: Negative for adenopathy.  Psychiatric/Behavioral: Positive for sleep disturbance. Negative for behavioral problems, confusion, decreased concentration, dysphoric mood, hallucinations, self-injury and suicidal ideas. The patient is nervous/anxious.      Objective:   BP 110/72 (BP Location: Left Arm, Patient Position: Sitting, Cuff Size: Large)   Pulse 94   Temp 98.6 F (37 C) (Temporal)   Resp 12   Ht 5\' 5"  (1.651  m)   Wt 277 lb (125.6 kg)   SpO2 97% Comment: room air  BMI 46.10 kg/m   Physical Exam  Constitutional: She appears well-developed and well-nourished.  Neck: Normal range of motion. Neck supple.  Cardiovascular: Normal rate, regular rhythm and normal heart sounds.  Pulmonary/Chest: Effort normal and breath sounds normal.  Abdominal: Soft. Bowel sounds are normal.  Psychiatric: She has a normal mood and affect. Her behavior is normal.  Judgment and thought content normal.  Pleasant, smiling, mood lighter and improved.  No suicidal or homicidal ideations.  Thought content goal directed without delusions, phobias, obsessions, or compulsions.  Vitals reviewed.  Dep[facility] screen [facility] 2/9 07/07/2018 05/07/2018 04/16/2018 11/11/2015 10/02/2015  Decreased Interest 0 0 0 0 1  Down, Depressed, Hopeless 1 1 0 0 1  PHQ - 2 Score 1 1 0 0 2  Altered sleeping - - - - 3  Tired, decreased energy - - - - 3  Change in appetite - - - - 1  Feeling bad or failure about yourself  - - - - 1  Trouble concentrating - - - - 0  Moving slowly or fidgety/restless - - - - 0  Suicidal thoughts - - - - 0  PHQ-9 Score - - - - 10     Assessment and Plan  1. Anxiety and depression Continue with Celexa.  Increase to 30 mg p.o. Daily. Patient counseled in detail regarding the risks of medication. Told to call or return to clinic if develop any worrisome signs or symptoms. Patient voiced understanding.  Suicide risks evaluated and documented in note if present or in the area below. Patient has protective factors of family and community support.  Patient reports that family believes is behaving rationally. Patient displays problem solving skills.   Patient specifically denies suicide ideation. Patient has access/information to healthcare contacts if situation or mood changes where patient is a risk to self or others or mood becomes unstable.   During the encounter, the patient had good eye contact and firm handshake  regarding safety contract and agreement to seek help if mood worsens and not to harm self.   Patient understands the treatment plan and is in agreement. Agrees to keep follow up and call prior or return to clinic if needed.   - citalopram (CELEXA) 20 MG tablet; Take 1.5 tablets (30 mg total) by mouth daily.  Dispense: 135 tablet; Refill: 0 Did discuss with patient today that I do recommend her follow-up with therapy.  She will follow-up in 2 to 4 weeks.  She will call with any questions or concerns.  Otherwise she will follow-up in continue with dietary modifications and exercise.  Sleep hygiene discussed.  If she is still having problems with her sleep after increasing the dose of Celexa 1 to 2 months or sooner if needed. 2. Hypothyroidism, unspecified type Stable.  Plan to recheck labs in approximately 4 months.  Continue medications as directed.  No follow-ups on file.  Follow-up in 2 months with new PCP office or sooner if needed.  Call with any questions or concerns prior to that. Aliene Beamsachel Maelle Sheaffer, MD 07/08/2018

## 2018-07-08 ENCOUNTER — Encounter: Payer: Self-pay | Admitting: Family Medicine

## 2018-11-16 DIAGNOSIS — J45901 Unspecified asthma with (acute) exacerbation: Secondary | ICD-10-CM | POA: Diagnosis not present

## 2018-12-08 ENCOUNTER — Emergency Department (HOSPITAL_COMMUNITY)
Admission: EM | Admit: 2018-12-08 | Discharge: 2018-12-08 | Disposition: A | Discharge status: Home / Self Care | Payer: BLUE CROSS/BLUE SHIELD | Attending: Emergency Medicine | Admitting: Emergency Medicine

## 2018-12-08 ENCOUNTER — Encounter (HOSPITAL_COMMUNITY): Payer: Self-pay | Admitting: Emergency Medicine

## 2018-12-08 ENCOUNTER — Other Ambulatory Visit: Payer: Self-pay

## 2018-12-08 DIAGNOSIS — R000 Tachycardia, unspecified: Secondary | ICD-10-CM | POA: Diagnosis not present

## 2018-12-08 DIAGNOSIS — J4521 Mild intermittent asthma with (acute) exacerbation: Secondary | ICD-10-CM

## 2018-12-08 DIAGNOSIS — Z79899 Other long term (current) drug therapy: Secondary | ICD-10-CM | POA: Diagnosis not present

## 2018-12-08 DIAGNOSIS — R062 Wheezing: Secondary | ICD-10-CM | POA: Diagnosis present

## 2018-12-08 DIAGNOSIS — E039 Hypothyroidism, unspecified: Secondary | ICD-10-CM | POA: Insufficient documentation

## 2018-12-08 LAB — CBC WITH DIFFERENTIAL/PLATELET
Abs Immature Granulocytes: 0.03 10*3/uL (ref 0.00–0.07)
BASOS ABS: 0.1 10*3/uL (ref 0.0–0.1)
Basophils Relative: 1 %
Eosinophils Absolute: 0.4 10*3/uL (ref 0.0–0.5)
Eosinophils Relative: 3 %
HCT: 39.4 % (ref 36.0–46.0)
Hemoglobin: 12.7 g/dL (ref 12.0–15.0)
Immature Granulocytes: 0 %
Lymphocytes Relative: 32 %
Lymphs Abs: 3.5 10*3/uL (ref 0.7–4.0)
MCH: 27.2 pg (ref 26.0–34.0)
MCHC: 32.2 g/dL (ref 30.0–36.0)
MCV: 84.4 fL (ref 80.0–100.0)
Monocytes Absolute: 0.7 10*3/uL (ref 0.1–1.0)
Monocytes Relative: 6 %
NEUTROS PCT: 58 %
NRBC: 0 % (ref 0.0–0.2)
Neutro Abs: 6.5 10*3/uL (ref 1.7–7.7)
Platelets: 277 10*3/uL (ref 150–400)
RBC: 4.67 MIL/uL (ref 3.87–5.11)
RDW: 13.8 % (ref 11.5–15.5)
WBC: 11.1 10*3/uL — ABNORMAL HIGH (ref 4.0–10.5)

## 2018-12-08 LAB — BASIC METABOLIC PANEL
Anion gap: 9 (ref 5–15)
BUN: 22 mg/dL — ABNORMAL HIGH (ref 6–20)
CHLORIDE: 106 mmol/L (ref 98–111)
CO2: 24 mmol/L (ref 22–32)
Calcium: 9.2 mg/dL (ref 8.9–10.3)
Creatinine, Ser: 0.7 mg/dL (ref 0.44–1.00)
GFR calc non Af Amer: >60 mL/min (ref >60)
Glucose, Bld: 124 mg/dL — ABNORMAL HIGH (ref 70–99)
Potassium: 3.5 mmol/L (ref 3.5–5.1)
Sodium: 139 mmol/L (ref 135–145)

## 2018-12-08 LAB — I-STAT BETA HCG BLOOD, ED (MC, WL, AP ONLY): I-stat hCG, quantitative: <5 m[IU]/mL (ref <5)

## 2018-12-08 MED ORDER — IPRATROPIUM-ALBUTEROL 0.5-2.5 (3) MG/3ML IN SOLN
3.0000 mL | Freq: Once | RESPIRATORY_TRACT | Status: AC
  Administered 2018-12-08: 3 mL via RESPIRATORY_TRACT

## 2018-12-08 MED ORDER — IPRATROPIUM-ALBUTEROL 0.5-2.5 (3) MG/3ML IN SOLN
RESPIRATORY_TRACT | Status: AC
  Filled 2018-12-08: qty 3

## 2018-12-08 MED ORDER — METHYLPREDNISOLONE SODIUM SUCC 125 MG IJ SOLR
125.0000 mg | Freq: Once | INTRAMUSCULAR | Status: AC
  Administered 2018-12-08: 125 mg via INTRAVENOUS
  Filled 2018-12-08: qty 2

## 2018-12-08 MED ORDER — PREDNISONE 20 MG PO TABS: ORAL_TABLET | ORAL | 0 refills | Status: DC

## 2018-12-08 MED ORDER — IPRATROPIUM-ALBUTEROL 0.5-2.5 (3) MG/3ML IN SOLN
3.0000 mL | Freq: Four times a day (QID) | RESPIRATORY_TRACT | Status: DC
  Administered 2018-12-08: 3 mL via RESPIRATORY_TRACT

## 2018-12-08 MED ORDER — IPRATROPIUM-ALBUTEROL 0.5-2.5 (3) MG/3ML IN SOLN: 3.0000 mL | RESPIRATORY_TRACT | Status: DC | PRN

## 2018-12-08 NOTE — ED Triage Notes (Signed)
Moving at work today, a lot of dust and dirt, onset of asthma attack this evening, no relief with inhaler.

## 2018-12-08 NOTE — ED Provider Notes (Signed)
[facility] EMERGENCY DEPARTMENT Provider Note   CSN: [id number] Arrival date & time: 12/08/18  2120     History   Chief Complaint Chief Complaint  Patient presents with  . Asthma    HPI Julie Chambers is a 38 y.o. female.  Patient working in a dusty environment today, triggered an asthma attack. She attempted her rescue inhaler at home without relief of symptoms. Patient presents with wheezing in all fields. Occasional non-productive cough. No chest pain, no fever.  The history is provided by the patient. No language interpreter was used.  Asthma  This is a recurrent problem. The current episode started 1 to 2 hours ago. The problem occurs rarely. The problem has not changed since onset.Associated symptoms include shortness of breath.    Past Medical History:  Diagnosis Date  . Allergy   . Anxiety   . Asthma   . Depression   . History of kidney stones   . Hypothyroidism   . 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 happened in high school and again as young adul no surgery only physical therapy  . Spontaneous abortion   . Tremor of left hand     Patient Active Problem List   Diagnosis Date Noted  . Morbid obesity (HCC) 04/16/2018  . Anxiety and depression 12/10/2013  . Hypothyroidism 03/20/2013  . Asthma 03/20/2013    Past Surgical History:  Procedure Laterality Date  . LAPAROSCOPIC TUBAL LIGATION Bilateral 12/15/2015   Procedure: LAPAROSCOPIC BILATERAL TUBAL LIGATION;  Surgeon: Huel Cote, MD;  Location: [facility];  Service: Gynecology;  Laterality: Bilateral;  . LITHOTRIPSY    . MOLE REMOVAL     skin precancer     OB History    Gravida  3   Para  2   Term  2   Preterm      AB  1   Living  2     SAB  1   TAB  0   Ectopic      Multiple      Live Births  2            Home Medications    Prior to Admission medications   Medication Sig Start Date End Date Taking? Authorizing Provider  citalopram (CELEXA) 20 MG  tablet Take 1.5 tablets (30 mg total) by mouth daily. 07/07/18   Aliene Beams, MD  levothyroxine (SYNTHROID, LEVOTHROID) 175 MCG tablet Take 1 tablet (175 mcg total) by mouth daily before breakfast. 10/05/16   Shade Flood, MD  VENTOLIN HFA 108 (90 BASE) MCG/ACT inhaler INHALE TWO PUFFS BY MOUTH EVERY FOUR HOURS AS NEEDED FOR WHEEZING OR SHORTNESS OF BREATH 10/29/15   Le, Thao P, DO    Family History Family History  Problem Relation Age of Onset  . Depression Mother   . Cancer Father        non hodgkins lymphoma  . Mental illness Sister        ocd  . Cancer Brother        neuroblastoma  . Diabetes Maternal Grandmother   . COPD Maternal Grandmother   . Hypertension Maternal Grandmother   . Anesthesia problems Neg Hx   . Hypotension Neg Hx   . Pseudochol deficiency Neg Hx   . Malignant hyperthermia Neg Hx     Social History Social History   Tobacco Use  . Smoking status: Never Smoker  . Smokeless tobacco: Never Used  Substance Use Topics  . Alcohol use:  Yes    Alcohol/week: 0.0 standard drinks    Comment: occasionally   . Drug use: No     Allergies   Patient has no known allergies.   Review of Systems Review of Systems  Respiratory: Positive for shortness of breath and wheezing.   All other systems reviewed and are negative.    Physical Exam Updated Vital Signs BP 115/86 (BP Location: Left Arm)   Pulse (!) 116   Temp 98.4 F (36.9 C) (Oral)   Resp 20   Ht 5\' 5"  (1.651 m)   Wt 124.7 kg   LMP 11/23/2018   SpO2 90%   BMI 45.76 kg/m   Physical Exam Vitals signs and nursing note reviewed.  Constitutional:      General: She is in acute distress.  HENT:     Head: Normocephalic.  Eyes:     Conjunctiva/sclera: Conjunctivae normal.  Neck:     Musculoskeletal: Neck supple.  Cardiovascular:     Rate and Rhythm: Tachycardia present.  Pulmonary:     Breath sounds: Wheezing present.  Chest:     Chest wall: No tenderness.  Abdominal:     General:  Bowel sounds are normal.     Palpations: Abdomen is soft.  Musculoskeletal: Normal range of motion.     Right lower leg: No edema.     Left lower leg: No edema.  Skin:    General: Skin is warm and dry.  Neurological:     Mental Status: She is alert and oriented to person, place, and time.  Psychiatric:        Mood and Affect: Mood normal.      ED Treatments / Results  Labs (all labs ordered are listed, but only abnormal results are displayed) Labs Reviewed  BASIC METABOLIC PANEL - Abnormal; Notable for the following components:      Result Value   Glucose, Bld 124 (*)    BUN 22 (*)    All other components within normal limits  CBC WITH DIFFERENTIAL/PLATELET - Abnormal; Notable for the following components:   WBC 11.1 (*)    All other components within normal limits  I-STAT BETA HCG BLOOD, ED ([facility], [facility], [facility] ONLY)    EKG None  Radiology No results found.  Procedures Procedures (including critical care time)  Medications Ordered in ED Medications  ipratropium-albuterol (DUONEB) 0.5-2.5 (3) MG/3ML nebulizer solution 3 mL (has no administration in time range)     Initial Impression / Assessment and Plan / ED Course  I have reviewed the triage vital signs and the nursing notes.  Pertinent labs & imaging results that were available during my care of the patient were reviewed by me and considered in my medical decision making (see chart for details).     Patient with mild signs and symptoms of asthma/RAD. Oxygen saturation is above 90%. No accessory muscle use, no cyanosis. Treated in the ED.  Patient feels improved after treatment. Will discharge with short course of steroids. Patient has rescue inhaler.. Pt instructed to follow up with PCP. Patient is hemodynamically stable. Discussed return precautions. Pt appears safe for discharge.    Final Clinical Impressions(s) / ED Diagnoses   Final diagnoses:  Mild intermittent asthma with exacerbation    ED Discharge Orders          Ordered    predniSONE (DELTASONE) 20 MG tablet     12/08/18 2308           Felicie MornSmith, Sanya Kobrin, NP 12/08/18 2311  Mancel BaleWentz, Elliott, MD 12/08/18 2329

## 2018-12-08 NOTE — Discharge Instructions (Signed)
Contact the customer service number on the back of your insurance card. They should be able to let you know which providers in their network are accepting new patients.

## 2019-01-25 DIAGNOSIS — F329 Major depressive disorder, single episode, unspecified: Secondary | ICD-10-CM | POA: Diagnosis not present

## 2019-03-15 DIAGNOSIS — E039 Hypothyroidism, unspecified: Secondary | ICD-10-CM | POA: Diagnosis not present

## 2019-03-15 DIAGNOSIS — J4520 Mild intermittent asthma, uncomplicated: Secondary | ICD-10-CM | POA: Diagnosis not present

## 2019-03-15 DIAGNOSIS — F411 Generalized anxiety disorder: Secondary | ICD-10-CM | POA: Diagnosis not present

## 2019-09-15 ENCOUNTER — Other Ambulatory Visit: Payer: Self-pay

## 2019-09-15 DIAGNOSIS — Z20828 Contact with and (suspected) exposure to other viral communicable diseases: Secondary | ICD-10-CM | POA: Diagnosis not present

## 2019-09-15 DIAGNOSIS — Z20822 Contact with and (suspected) exposure to covid-19: Secondary | ICD-10-CM

## 2019-09-16 DIAGNOSIS — Z0000 Encounter for general adult medical examination without abnormal findings: Secondary | ICD-10-CM | POA: Diagnosis not present

## 2019-09-16 DIAGNOSIS — L237 Allergic contact dermatitis due to plants, except food: Secondary | ICD-10-CM | POA: Diagnosis not present

## 2019-09-16 DIAGNOSIS — R7303 Prediabetes: Secondary | ICD-10-CM | POA: Diagnosis not present

## 2019-09-16 DIAGNOSIS — F411 Generalized anxiety disorder: Secondary | ICD-10-CM | POA: Diagnosis not present

## 2019-09-16 DIAGNOSIS — E039 Hypothyroidism, unspecified: Secondary | ICD-10-CM | POA: Diagnosis not present

## 2019-09-16 LAB — NOVEL CORONAVIRUS, NAA: SARS-CoV-2, NAA: NOT DETECTED

## 2019-09-17 ENCOUNTER — Telehealth: Payer: Self-pay | Admitting: General Practice

## 2019-09-17 NOTE — Telephone Encounter (Signed)
Patient is calling to receive her negative COVID results. Patient expressed understanding. °

## 2020-01-06 ENCOUNTER — Ambulatory Visit: Payer: BLUE CROSS/BLUE SHIELD | Attending: Internal Medicine

## 2020-01-06 ENCOUNTER — Other Ambulatory Visit: Payer: Self-pay

## 2020-01-06 DIAGNOSIS — Z20822 Contact with and (suspected) exposure to covid-19: Secondary | ICD-10-CM | POA: Insufficient documentation

## 2020-01-07 LAB — NOVEL CORONAVIRUS, NAA: SARS-CoV-2, NAA: NOT DETECTED

## 2020-01-11 DIAGNOSIS — F330 Major depressive disorder, recurrent, mild: Secondary | ICD-10-CM | POA: Diagnosis not present

## 2020-01-11 DIAGNOSIS — Z62810 Personal history of physical and sexual abuse in childhood: Secondary | ICD-10-CM | POA: Diagnosis not present

## 2020-01-19 DIAGNOSIS — Z62810 Personal history of physical and sexual abuse in childhood: Secondary | ICD-10-CM | POA: Diagnosis not present

## 2020-01-19 DIAGNOSIS — F330 Major depressive disorder, recurrent, mild: Secondary | ICD-10-CM | POA: Diagnosis not present

## 2020-01-21 DIAGNOSIS — Z6841 Body Mass Index (BMI) 40.0 and over, adult: Secondary | ICD-10-CM | POA: Diagnosis not present

## 2020-01-21 DIAGNOSIS — J029 Acute pharyngitis, unspecified: Secondary | ICD-10-CM | POA: Diagnosis not present

## 2020-01-21 DIAGNOSIS — U071 COVID-19: Secondary | ICD-10-CM | POA: Diagnosis not present

## 2020-01-26 DIAGNOSIS — F330 Major depressive disorder, recurrent, mild: Secondary | ICD-10-CM | POA: Diagnosis not present

## 2020-01-26 DIAGNOSIS — Z62810 Personal history of physical and sexual abuse in childhood: Secondary | ICD-10-CM | POA: Diagnosis not present

## 2020-01-31 DIAGNOSIS — Z62810 Personal history of physical and sexual abuse in childhood: Secondary | ICD-10-CM | POA: Diagnosis not present

## 2020-01-31 DIAGNOSIS — F330 Major depressive disorder, recurrent, mild: Secondary | ICD-10-CM | POA: Diagnosis not present

## 2020-02-07 DIAGNOSIS — Z62810 Personal history of physical and sexual abuse in childhood: Secondary | ICD-10-CM | POA: Diagnosis not present

## 2020-02-07 DIAGNOSIS — F330 Major depressive disorder, recurrent, mild: Secondary | ICD-10-CM | POA: Diagnosis not present

## 2020-02-16 DIAGNOSIS — F330 Major depressive disorder, recurrent, mild: Secondary | ICD-10-CM | POA: Diagnosis not present

## 2020-02-16 DIAGNOSIS — Z62810 Personal history of physical and sexual abuse in childhood: Secondary | ICD-10-CM | POA: Diagnosis not present

## 2020-03-09 DIAGNOSIS — F330 Major depressive disorder, recurrent, mild: Secondary | ICD-10-CM | POA: Diagnosis not present

## 2020-03-09 DIAGNOSIS — Z62810 Personal history of physical and sexual abuse in childhood: Secondary | ICD-10-CM | POA: Diagnosis not present

## 2020-06-24 ENCOUNTER — Ambulatory Visit: Payer: Self-pay | Attending: Internal Medicine

## 2020-06-24 DIAGNOSIS — Z23 Encounter for immunization: Secondary | ICD-10-CM

## 2020-06-24 NOTE — Progress Notes (Signed)
   Covid-19 Vaccination Clinic  Name:  Bunnie Rehberg    MRN: [id number] DOB: 09-17-1981  06/24/2020  Ms. Pomplun was observed post Covid-19 immunization for 15 minutes without incident. She was provided with Vaccine Information Sheet and instruction to access the V-Safe system.   Ms. Moronta was instructed to call 911 with any severe reactions post vaccine: Marland Kitchen Difficulty breathing  . Swelling of face and throat  . A fast heartbeat  . A bad rash all over body  . Dizziness and weakness   Immunizations Administered    Name Date Dose VIS Date Route   Pfizer COVID-19 Vaccine 06/24/2020 11:11 AM 0.3 mL 01/26/2019 Intramuscular   Manufacturer: [employer], [employer]   Lot: [id number]   NDC: [address]-8

## 2020-06-28 DIAGNOSIS — J029 Acute pharyngitis, unspecified: Secondary | ICD-10-CM | POA: Diagnosis not present

## 2020-06-28 DIAGNOSIS — U071 COVID-19: Secondary | ICD-10-CM | POA: Diagnosis not present

## 2020-07-20 ENCOUNTER — Ambulatory Visit: Payer: Self-pay

## 2020-08-03 DIAGNOSIS — R7303 Prediabetes: Secondary | ICD-10-CM | POA: Diagnosis not present

## 2020-08-03 DIAGNOSIS — E039 Hypothyroidism, unspecified: Secondary | ICD-10-CM | POA: Diagnosis not present

## 2020-10-12 DIAGNOSIS — H1045 Other chronic allergic conjunctivitis: Secondary | ICD-10-CM | POA: Diagnosis not present

## 2020-11-22 DIAGNOSIS — Z20822 Contact with and (suspected) exposure to covid-19: Secondary | ICD-10-CM | POA: Diagnosis not present

## 2021-02-02 DIAGNOSIS — Z113 Encounter for screening for infections with a predominantly sexual mode of transmission: Secondary | ICD-10-CM | POA: Diagnosis not present

## 2021-02-02 DIAGNOSIS — R2241 Localized swelling, mass and lump, right lower limb: Secondary | ICD-10-CM | POA: Diagnosis not present

## 2021-02-02 DIAGNOSIS — N9089 Other specified noninflammatory disorders of vulva and perineum: Secondary | ICD-10-CM | POA: Diagnosis not present

## 2021-04-25 DIAGNOSIS — D1739 Benign lipomatous neoplasm of skin and subcutaneous tissue of other sites: Secondary | ICD-10-CM | POA: Diagnosis not present

## 2021-08-22 ENCOUNTER — Emergency Department (HOSPITAL_COMMUNITY)
Admission: EM | Admit: 2021-08-22 | Discharge: 2021-08-22 | Disposition: A | Discharge status: Home / Self Care | Payer: BC Managed Care – PPO | Attending: Emergency Medicine | Admitting: Emergency Medicine

## 2021-08-22 ENCOUNTER — Emergency Department (HOSPITAL_COMMUNITY): Payer: BC Managed Care – PPO

## 2021-08-22 DIAGNOSIS — S59902A Unspecified injury of left elbow, initial encounter: Secondary | ICD-10-CM | POA: Diagnosis not present

## 2021-08-22 DIAGNOSIS — S50312A Abrasion of left elbow, initial encounter: Secondary | ICD-10-CM | POA: Insufficient documentation

## 2021-08-22 DIAGNOSIS — R58 Hemorrhage, not elsewhere classified: Secondary | ICD-10-CM | POA: Diagnosis not present

## 2021-08-22 DIAGNOSIS — Z5321 Procedure and treatment not carried out due to patient leaving prior to being seen by health care provider: Secondary | ICD-10-CM | POA: Diagnosis not present

## 2021-08-22 DIAGNOSIS — M40204 Unspecified kyphosis, thoracic region: Secondary | ICD-10-CM | POA: Diagnosis not present

## 2021-08-22 DIAGNOSIS — M545 Low back pain, unspecified: Secondary | ICD-10-CM | POA: Diagnosis not present

## 2021-08-22 DIAGNOSIS — M25522 Pain in left elbow: Secondary | ICD-10-CM | POA: Diagnosis not present

## 2021-08-22 DIAGNOSIS — M79603 Pain in arm, unspecified: Secondary | ICD-10-CM | POA: Diagnosis not present

## 2021-08-22 DIAGNOSIS — I10 Essential (primary) hypertension: Secondary | ICD-10-CM | POA: Diagnosis not present

## 2021-08-22 DIAGNOSIS — Y92410 Unspecified street and highway as the place of occurrence of the external cause: Secondary | ICD-10-CM | POA: Insufficient documentation
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
## 2021-08-22 NOTE — ED Notes (Signed)
Pt left. 

## 2021-08-30 ENCOUNTER — Ambulatory Visit
Admission: EM | Admit: 2021-08-30 | Discharge: 2021-08-30 | Disposition: A | Discharge status: Home / Self Care | Payer: BC Managed Care – PPO | Attending: Family Medicine | Admitting: Family Medicine

## 2021-08-30 ENCOUNTER — Encounter: Payer: Self-pay | Admitting: Emergency Medicine

## 2021-08-30 ENCOUNTER — Other Ambulatory Visit: Payer: Self-pay

## 2021-08-30 DIAGNOSIS — M25522 Pain in left elbow: Secondary | ICD-10-CM
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
## 2022-01-21 ENCOUNTER — Ambulatory Visit
Admission: EM | Admit: 2022-01-21 | Discharge: 2022-01-21 | Disposition: A | Discharge status: Home / Self Care | Payer: BC Managed Care – PPO | Attending: Family Medicine | Admitting: Family Medicine

## 2022-01-21 ENCOUNTER — Other Ambulatory Visit: Payer: Self-pay

## 2022-01-21 DIAGNOSIS — J0300 Acute streptococcal tonsillitis, unspecified: Secondary | ICD-10-CM

## 2022-01-21 DIAGNOSIS — Z20828 Contact with and (suspected) exposure to other viral communicable diseases: Secondary | ICD-10-CM | POA: Diagnosis not present

## 2022-01-21 DIAGNOSIS — F419 Anxiety disorder, unspecified: Secondary | ICD-10-CM | POA: Diagnosis not present

## 2022-01-21 DIAGNOSIS — F32A Depression, unspecified: Secondary | ICD-10-CM

## 2022-01-21 DIAGNOSIS — J069 Acute upper respiratory infection, unspecified: Secondary | ICD-10-CM

## 2022-01-21 LAB — POCT RAPID STREP A (OFFICE): Rapid Strep A Screen: POSITIVE — AB

## 2022-01-21 MED ORDER — ALBUTEROL SULFATE HFA 108 (90 BASE) MCG/ACT IN AERS: INHALATION_SPRAY | RESPIRATORY_TRACT | 0 refills | Status: DC

## 2022-01-21 MED ORDER — CITALOPRAM HYDROBROMIDE 20 MG PO TABS: 20.0000 mg | ORAL_TABLET | Freq: Every day | ORAL | 0 refills | Status: DC

## 2022-01-21 MED ORDER — AMOXICILLIN 875 MG PO TABS: 875.0000 mg | ORAL_TABLET | Freq: Two times a day (BID) | ORAL | 0 refills | Status: DC

## 2022-01-21 MED ORDER — LIDOCAINE VISCOUS HCL 2 % MT SOLN: 10.0000 mL | OROMUCOSAL | 0 refills | Status: DC | PRN
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
## 2022-01-21 NOTE — ED Triage Notes (Signed)
Patient states that yesterday morning she woke up with lots of sinus pressure with aches and chills  Patient states she has a bad sore throat  Patient states she OTC cold and cough  and ibuprofen with little relief  Patient states that she is also having body aches  Patient states she also needs her Citalopram refilled

## 2022-01-22 LAB — COVID-19, FLU A+B NAA
Influenza A, NAA: NOT DETECTED
Influenza B, NAA: NOT DETECTED
SARS-CoV-2, NAA: NOT DETECTED

## 2022-04-17 ENCOUNTER — Other Ambulatory Visit: Payer: Self-pay | Admitting: Family Medicine

## 2022-04-17 DIAGNOSIS — F32A Depression, unspecified: Secondary | ICD-10-CM

## 2022-04-17 NOTE — Telephone Encounter (Signed)
? ?  Notes to clinic: Refill request sent to [facility]- forwarded to provider- UC patient  ? ?Requested Prescriptions  ?Pending Prescriptions Disp Refills  ? citalopram (CELEXA) 20 MG tablet [Pharmacy Med Name: CITALOPRAM 20MG  TABLETS] 90 tablet 0  ?  Sig: TAKE 1 TABLET(20 MG) BY MOUTH DAILY  ?  ? There is no refill protocol information for this order  ?  ? ? ? ?Requested Prescriptions  ?Pending Prescriptions Disp Refills  ? citalopram (CELEXA) 20 MG tablet [Pharmacy Med Name: CITALOPRAM 20MG  TABLETS] 90 tablet 0  ?  Sig: TAKE 1 TABLET(20 MG) BY MOUTH DAILY  ?  ? There is no refill protocol information for this order  ?  ? ? ? ?

## 2022-07-20 DIAGNOSIS — Z760 Encounter for issue of repeat prescription: Secondary | ICD-10-CM | POA: Diagnosis not present

## 2022-07-20 DIAGNOSIS — Z1331 Encounter for screening for depression: Secondary | ICD-10-CM | POA: Diagnosis not present

## 2022-10-23 DIAGNOSIS — F329 Major depressive disorder, single episode, unspecified: Secondary | ICD-10-CM | POA: Diagnosis not present

## 2022-10-30 ENCOUNTER — Other Ambulatory Visit: Payer: Self-pay

## 2022-10-30 ENCOUNTER — Ambulatory Visit
Admission: EM | Admit: 2022-10-30 | Discharge: 2022-10-30 | Disposition: A | Discharge status: Home / Self Care | Payer: BC Managed Care – PPO | Attending: Nurse Practitioner | Admitting: Nurse Practitioner

## 2022-10-30 ENCOUNTER — Encounter: Payer: Self-pay | Admitting: Emergency Medicine

## 2022-10-30 DIAGNOSIS — E039 Hypothyroidism, unspecified: Secondary | ICD-10-CM

## 2022-10-30 DIAGNOSIS — Z760 Encounter for issue of repeat prescription: Secondary | ICD-10-CM

## 2022-10-30 MED ORDER — LEVOTHYROXINE SODIUM 175 MCG PO TABS: 175.0000 ug | ORAL_TABLET | Freq: Every day | ORAL | 0 refills | Status: DC

## 2022-10-30 NOTE — Discharge Instructions (Signed)
Take medication as prescribed. Follow-up with your PCP as scheduled for continued care. Follow-up as needed.
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
## 2022-10-30 NOTE — ED Triage Notes (Signed)
Pt reports need prescription for levothyroxine 175 mcg. Pt reports last dose x4 days. Pt reports new pcp appt on 12/8.

## 2022-11-08 DIAGNOSIS — F329 Major depressive disorder, single episode, unspecified: Secondary | ICD-10-CM | POA: Diagnosis not present

## 2022-11-08 DIAGNOSIS — E039 Hypothyroidism, unspecified: Secondary | ICD-10-CM | POA: Diagnosis not present

## 2022-11-08 DIAGNOSIS — Z0000 Encounter for general adult medical examination without abnormal findings: Secondary | ICD-10-CM | POA: Diagnosis not present

## 2022-11-08 DIAGNOSIS — Z131 Encounter for screening for diabetes mellitus: Secondary | ICD-10-CM | POA: Diagnosis not present

## 2022-11-08 DIAGNOSIS — R251 Tremor, unspecified: Secondary | ICD-10-CM | POA: Diagnosis not present

## 2022-11-08 DIAGNOSIS — J45909 Unspecified asthma, uncomplicated: Secondary | ICD-10-CM | POA: Diagnosis not present

## 2022-11-12 ENCOUNTER — Encounter: Payer: Self-pay | Admitting: Neurology

## 2022-12-03 ENCOUNTER — Ambulatory Visit
Admission: EM | Admit: 2022-12-03 | Discharge: 2022-12-03 | Disposition: A | Discharge status: Home / Self Care | Payer: BC Managed Care – PPO | Attending: Urgent Care | Admitting: Urgent Care

## 2022-12-03 ENCOUNTER — Encounter: Payer: Self-pay | Admitting: Emergency Medicine

## 2022-12-03 ENCOUNTER — Ambulatory Visit (INDEPENDENT_AMBULATORY_CARE_PROVIDER_SITE_OTHER): Payer: BC Managed Care – PPO

## 2022-12-03 DIAGNOSIS — J209 Acute bronchitis, unspecified: Secondary | ICD-10-CM | POA: Diagnosis not present

## 2022-12-03 DIAGNOSIS — U071 COVID-19: Secondary | ICD-10-CM | POA: Insufficient documentation

## 2022-12-03 DIAGNOSIS — J4530 Mild persistent asthma, uncomplicated: Secondary | ICD-10-CM | POA: Diagnosis not present

## 2022-12-03 DIAGNOSIS — R059 Cough, unspecified: Secondary | ICD-10-CM | POA: Diagnosis not present

## 2022-12-03 MED ORDER — PREDNISONE 50 MG PO TABS: 50.0000 mg | ORAL_TABLET | Freq: Every day | ORAL | 0 refills | Status: DC

## 2022-12-03 MED ORDER — PROMETHAZINE-DM 6.25-15 MG/5ML PO SYRP: 5.0000 mL | ORAL_SOLUTION | Freq: Three times a day (TID) | ORAL | 0 refills | Status: DC | PRN

## 2022-12-03 MED ORDER — CETIRIZINE HCL 10 MG PO TABS: 10.0000 mg | ORAL_TABLET | Freq: Every day | ORAL | 0 refills | Status: DC
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
## 2022-12-03 NOTE — ED Triage Notes (Signed)
Cough x 3 weeks. Exposed to covid from family.  Felt bad yesterday with fatigue.  Productive cough with brown sputum x 2 weeks.

## 2022-12-04 LAB — SARS CORONAVIRUS 2 (TAT 6-24 HRS): SARS Coronavirus 2: NEGATIVE

## 2022-12-17 NOTE — Progress Notes (Deleted)
Assessment/Plan:   ***  Subjective:   Julie Chambers was seen today in the movement disorders clinic for neurologic consultation at the request of Emelia Loron, NP.  The consultation is for the evaluation of resting and intention tremor of the L hand.  Tremor: {yes no:314532}   How long has it been going on? ***  At rest or with activation?  ***  When is it noted the most?  ***  Fam hx of tremor?  {yes [id number]  Located where?  ***  Affected by caffeine:  {yes no:314532}  Affected by alcohol:  {yes no:314532}  Affected by stress:  {yes no:314532}  Affected by fatigue:  {yes no:314532}  Spills soup if on spoon:  {yes no:314532}  Spills glass of liquid if full:  {yes no:314532}  Affects ADL's (tying shoes, brushing teeth, etc):  {yes no:314532}  Tremor inducing meds:  {yes no:314532}ventolin; was recently on prednisone  Other Specific Symptoms:  Voice: *** Sleep: ***  Vivid Dreams:  {yes no:314532}  Acting out dreams:  {yes no:314532} Wet Pillows: {yes no:314532} Postural symptoms:  {yes no:314532}  Falls?  {yes no:314532} Bradykinesia symptoms: {parkinson brady:18041} Loss of smell:  {yes no:314532} Loss of taste:  {yes no:314532} Urinary Incontinence:  {yes no:314532} Difficulty Swallowing:  {yes no:314532} Handwriting, micrographia: {yes no:314532} Trouble with ADL's:  {yes no:314532}  Trouble buttoning clothing: {yes no:314532} Depression:  {yes no:314532} Memory changes:  {yes no:314532} Hallucinations:  {yes no:314532}  visual distortions: {yes no:314532} N/V:  {yes no:314532} Lightheaded:  {yes no:314532}  Syncope: {yes no:314532} Diplopia:  {yes no:314532} Dyskinesia:  {yes no:314532}  Neuroimaging of the brain has not previously been performed.  It *** available for my review today.  PREVIOUS MEDICATIONS: {Parkinson's RX:18200}  ALLERGIES:  No Known Allergies  CURRENT MEDICATIONS:  Current Outpatient Medications  Medication Instructions    albuterol (VENTOLIN HFA) 108 (90 Base) MCG/ACT inhaler INHALE TWO PUFFS BY MOUTH EVERY FOUR HOURS AS NEEDED FOR WHEEZING OR SHORTNESS OF BREATH   cetirizine (ZYRTEC ALLERGY) 10 mg, Oral, Daily   citalopram (CELEXA) 20 mg, Oral, Daily   levothyroxine (SYNTHROID) 175 mcg, Oral, Daily before breakfast   predniSONE (DELTASONE) 50 mg, Oral, Daily with breakfast   promethazine-dextromethorphan (PROMETHAZINE-DM) 6.25-15 MG/5ML syrup 5 mLs, Oral, 3 times daily PRN    Objective:   PHYSICAL EXAMINATION:    VITALS:  There were no vitals filed for this visit.  GEN:  The patient appears stated age and is in NAD. HEENT:  Normocephalic, atraumatic.  The mucous membranes are moist. The superficial temporal arteries are without ropiness or tenderness. CV:  RRR Lungs:  CTAB Neck/HEME:  There are no carotid bruits bilaterally.  Neurological examination:  Orientation: The patient is alert and oriented x3.  Cranial nerves: There is good facial symmetry.  Extraocular muscles are intact. The visual fields are full to confrontational testing. The speech is fluent and clear. Soft palate rises symmetrically and there is no tongue deviation. Hearing is intact to conversational tone. Sensation: Sensation is intact to light touch throughout (facial, trunk, extremities). Vibration is intact at the bilateral big toe. There is no extinction with double simultaneous stimulation.  Motor: Strength is 5/5 in the bilateral upper and lower extremities.   Shoulder shrug is equal and symmetric.  There is no pronator drift. Deep tendon reflexes: Deep tendon reflexes are 2/4 at the bilateral biceps, triceps, brachioradialis, patella and achilles. Plantar responses are downgoing bilaterally.  Movement examination: Tone: There is ***tone in the bilateral upper extremities.  The tone in the lower extremities is ***.  Abnormal movements: *** Coordination:  There is *** decremation with RAM's, *** Gait and Station: The patient has  *** difficulty arising out of a deep-seated chair without the use of the hands. The patient's stride length is ***.  The patient has a *** pull test.     I have reviewed and interpreted the following labs independently   Chemistry      Component Value Date/Time   NA 139 12/08/2018 2120   K 3.5 12/08/2018 2120   CL 106 12/08/2018 2120   CO2 24 12/08/2018 2120   BUN 22 (H) 12/08/2018 2120   CREATININE 0.70 12/08/2018 2120   CREATININE 0.58 04/16/2018 1054      Component Value Date/Time   CALCIUM 9.2 12/08/2018 2120   ALKPHOS 58 10/02/2015 1853   AST 17 10/02/2015 1853   ALT 24 10/02/2015 1853   BILITOT 0.3 10/02/2015 1853      Lab Results  Component Value Date   TSH 0.68 04/16/2018   Lab Results  Component Value Date   WBC 11.1 (H) 12/08/2018   HGB 12.7 12/08/2018   HCT 39.4 12/08/2018   MCV 84.4 12/08/2018   PLT 277 12/08/2018      Total time spent on today's visit was ***greater than 60 minutes, including both face-to-face time and nonface-to-face time.  Time included that spent on review of records (prior notes available to me/labs/imaging if pertinent), discussing treatment and goals, answering patient's questions and coordinating care.  Cc:  Pcp, No

## 2022-12-19 ENCOUNTER — Ambulatory Visit: Payer: Self-pay | Admitting: Neurology

## 2023-03-04 ENCOUNTER — Encounter: Payer: Self-pay | Admitting: Emergency Medicine

## 2023-03-04 ENCOUNTER — Other Ambulatory Visit: Payer: Self-pay

## 2023-03-04 ENCOUNTER — Ambulatory Visit
Admission: EM | Admit: 2023-03-04 | Discharge: 2023-03-04 | Disposition: A | Discharge status: Home / Self Care | Payer: BC Managed Care – PPO | Attending: Nurse Practitioner | Admitting: Nurse Practitioner

## 2023-03-04 DIAGNOSIS — Z1152 Encounter for screening for COVID-19: Secondary | ICD-10-CM

## 2023-03-04 DIAGNOSIS — J069 Acute upper respiratory infection, unspecified: Secondary | ICD-10-CM | POA: Diagnosis not present

## 2023-03-04 LAB — POCT RAPID STREP A (OFFICE): Rapid Strep A Screen: NEGATIVE

## 2023-03-04 MED ORDER — BENZONATATE 100 MG PO CAPS: 100.0000 mg | ORAL_CAPSULE | Freq: Three times a day (TID) | ORAL | 0 refills | Status: DC | PRN

## 2023-03-04 MED ORDER — PROMETHAZINE-DM 6.25-15 MG/5ML PO SYRP: 5.0000 mL | ORAL_SOLUTION | Freq: Every evening | ORAL | 0 refills | Status: DC | PRN
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
## 2023-03-04 NOTE — ED Triage Notes (Signed)
Pt reports sore throat, nasal congestion, chills, generalized body aches since yesterday.

## 2023-03-04 NOTE — Discharge Instructions (Addendum)
The strep throat test today is negative.  You have a viral upper respiratory infection.  Symptoms should improve over the next week to 10 days.  If you develop chest pain or shortness of breath, go to the emergency room.  We have tested you today for COVID-19.  You will see the results in Mychart and we will call you with positive results.  Please stay home and isolate until you are aware of the results.    Some things that can make you feel better are: - Increased rest - Increasing fluid with water/sugar free electrolytes - Acetaminophen and ibuprofen as needed for fever/pain - Salt water gargling, chloraseptic spray and throat lozenges - OTC guaifenesin (Mucinex) 600 mg twice daily for congestion - Saline sinus flushes or a neti pot - Humidifying the air -Tessalon Perles during the day as needed for dry cough and cough syrup at nighttime as needed for dry cough

## 2023-03-05 LAB — SARS CORONAVIRUS 2 (TAT 6-24 HRS): SARS Coronavirus 2: NEGATIVE
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
## 2023-07-18 DIAGNOSIS — Z01411 Encounter for gynecological examination (general) (routine) with abnormal findings: Secondary | ICD-10-CM | POA: Diagnosis not present

## 2023-07-18 DIAGNOSIS — Z1231 Encounter for screening mammogram for malignant neoplasm of breast: Secondary | ICD-10-CM | POA: Diagnosis not present

## 2023-07-18 DIAGNOSIS — Z202 Contact with and (suspected) exposure to infections with a predominantly sexual mode of transmission: Secondary | ICD-10-CM | POA: Diagnosis not present

## 2023-07-18 DIAGNOSIS — Z1151 Encounter for screening for human papillomavirus (HPV): Secondary | ICD-10-CM | POA: Diagnosis not present

## 2023-07-18 DIAGNOSIS — Z130 Encounter for screening for diseases of the blood and blood-forming organs and certain disorders involving the immune mechanism: Secondary | ICD-10-CM | POA: Diagnosis not present

## 2023-07-18 DIAGNOSIS — Z124 Encounter for screening for malignant neoplasm of cervix: Secondary | ICD-10-CM | POA: Diagnosis not present

## 2023-09-03 DIAGNOSIS — M7731 Calcaneal spur, right foot: Secondary | ICD-10-CM | POA: Diagnosis not present

## 2023-09-03 DIAGNOSIS — Y929 Unspecified place or not applicable: Secondary | ICD-10-CM | POA: Diagnosis not present

## 2023-09-03 DIAGNOSIS — R519 Headache, unspecified: Secondary | ICD-10-CM | POA: Diagnosis not present

## 2023-09-03 DIAGNOSIS — M47816 Spondylosis without myelopathy or radiculopathy, lumbar region: Secondary | ICD-10-CM | POA: Diagnosis not present

## 2023-09-03 DIAGNOSIS — S0990XA Unspecified injury of head, initial encounter: Secondary | ICD-10-CM | POA: Diagnosis not present

## 2023-09-03 DIAGNOSIS — Y9389 Activity, other specified: Secondary | ICD-10-CM | POA: Diagnosis not present

## 2023-09-03 DIAGNOSIS — Z753 Unavailability and inaccessibility of health-care facilities: Secondary | ICD-10-CM | POA: Diagnosis not present

## 2023-09-03 DIAGNOSIS — M545 Low back pain, unspecified: Secondary | ICD-10-CM | POA: Diagnosis not present

## 2023-09-03 DIAGNOSIS — S0081XA Abrasion of other part of head, initial encounter: Secondary | ICD-10-CM | POA: Diagnosis not present

## 2023-09-03 DIAGNOSIS — M79671 Pain in right foot: Secondary | ICD-10-CM | POA: Diagnosis not present

## 2023-12-28 DIAGNOSIS — J4520 Mild intermittent asthma, uncomplicated: Secondary | ICD-10-CM | POA: Diagnosis not present

## 2023-12-28 DIAGNOSIS — E038 Other specified hypothyroidism: Secondary | ICD-10-CM | POA: Diagnosis not present

## 2024-04-27 ENCOUNTER — Emergency Department (HOSPITAL_COMMUNITY)
Admission: EM | Admit: 2024-04-27 | Discharge: 2024-04-27 | Disposition: A | Discharge status: Home / Self Care | Attending: Emergency Medicine | Admitting: Emergency Medicine

## 2024-04-27 ENCOUNTER — Other Ambulatory Visit: Payer: Self-pay

## 2024-04-27 ENCOUNTER — Emergency Department (HOSPITAL_COMMUNITY)

## 2024-04-27 ENCOUNTER — Encounter (HOSPITAL_COMMUNITY): Payer: Self-pay

## 2024-04-27 DIAGNOSIS — Z760 Encounter for issue of repeat prescription: Secondary | ICD-10-CM

## 2024-04-27 DIAGNOSIS — M79661 Pain in right lower leg: Secondary | ICD-10-CM | POA: Diagnosis not present

## 2024-04-27 DIAGNOSIS — I82451 Acute embolism and thrombosis of right peroneal vein: Secondary | ICD-10-CM

## 2024-04-27 DIAGNOSIS — E039 Hypothyroidism, unspecified: Secondary | ICD-10-CM | POA: Diagnosis not present

## 2024-04-27 DIAGNOSIS — M7989 Other specified soft tissue disorders: Secondary | ICD-10-CM | POA: Diagnosis not present

## 2024-04-27 DIAGNOSIS — Z79899 Other long term (current) drug therapy: Secondary | ICD-10-CM | POA: Insufficient documentation

## 2024-04-27 DIAGNOSIS — J45909 Unspecified asthma, uncomplicated: Secondary | ICD-10-CM | POA: Insufficient documentation

## 2024-04-27 DIAGNOSIS — L03115 Cellulitis of right lower limb: Secondary | ICD-10-CM | POA: Diagnosis not present

## 2024-04-27 DIAGNOSIS — Z7901 Long term (current) use of anticoagulants: Secondary | ICD-10-CM | POA: Diagnosis not present

## 2024-04-27 LAB — BASIC METABOLIC PANEL WITH GFR
Anion gap: 10 (ref 5–15)
BUN: 24 mg/dL — ABNORMAL HIGH (ref 6–20)
CO2: 26 mmol/L (ref 22–32)
Calcium: 9.1 mg/dL (ref 8.9–10.3)
Chloride: 101 mmol/L (ref 98–111)
Creatinine, Ser: 0.81 mg/dL (ref 0.44–1.00)
GFR, Estimated: >60 mL/min (ref >60)
Glucose, Bld: 102 mg/dL — ABNORMAL HIGH (ref 70–99)
Potassium: 4 mmol/L (ref 3.5–5.1)
Sodium: 137 mmol/L (ref 135–145)

## 2024-04-27 LAB — TSH: TSH: 107.191 u[IU]/mL — ABNORMAL HIGH (ref 0.350–4.500)

## 2024-04-27 LAB — CBC
HCT: 37.9 % (ref 36.0–46.0)
Hemoglobin: 12.7 g/dL (ref 12.0–15.0)
MCH: 29.2 pg (ref 26.0–34.0)
MCHC: 33.5 g/dL (ref 30.0–36.0)
MCV: 87.1 fL (ref 80.0–100.0)
Platelets: 279 10*3/uL (ref 150–400)
RBC: 4.35 MIL/uL (ref 3.87–5.11)
RDW: 15.1 % (ref 11.5–15.5)
WBC: 7 10*3/uL (ref 4.0–10.5)
nRBC: 0 % (ref 0.0–0.2)

## 2024-04-27 LAB — CBG MONITORING, ED: Glucose-Capillary: 96 mg/dL (ref 70–99)

## 2024-04-27 MED ORDER — APIXABAN (ELIQUIS) VTE STARTER PACK (10MG AND 5MG): ORAL_TABLET | ORAL | 0 refills | Status: DC

## 2024-04-27 MED ORDER — LEVOTHYROXINE SODIUM 175 MCG PO TABS: 175.0000 ug | ORAL_TABLET | Freq: Every day | ORAL | 0 refills | Status: DC

## 2024-04-27 NOTE — ED Provider Notes (Signed)
 [place] EMERGENCY DEPARTMENT AT [facility] Provider Note   CSN: [id number] Arrival date & time: 04/27/24  4098     History  Chief Complaint  Patient presents with   Leg Pain    Julie Chambers is a 43 y.o. female with a history including asthma and hypothyroidism which is not currently being treated as she does not have a current PCP, presenting for evaluation of right calf swelling and tight sensation in her mid calf.  Her symptoms started about 5 days ago.  She describes a deep aching sensation in the calf.  She denies any injuries, she did recently take a long car trip to the beach and has a sedentary job.  She is not on OCPs.  Denies history of DVT.  She denies chest pain, shortness of breath or pleuritic symptoms.  The history is provided by the patient.       Home Medications Prior to Admission medications   Medication Sig Start Date End Date Taking? Authorizing Provider  APIXABAN  (ELIQUIS ) VTE STARTER PACK (10MG  AND 5MG ) Take as directed on package: start with two-5mg  tablets twice daily for 7 days. On day 8, switch to one-5mg  tablet twice daily. 04/27/24  Yes Nieshia Larmon, PA-C  levothyroxine  (SYNTHROID ) 175 MCG tablet Take 1 tablet (175 mcg total) by mouth daily before breakfast. 04/27/24  Yes Peace Jost, PA-C  albuterol  (VENTOLIN  HFA) 108 (90 Base) MCG/ACT inhaler INHALE TWO PUFFS BY MOUTH EVERY FOUR HOURS AS NEEDED FOR WHEEZING OR SHORTNESS OF BREATH 01/21/22   Corbin Dess, PA-C  benzonatate  (TESSALON ) 100 MG capsule Take 1 capsule (100 mg total) by mouth 3 (three) times daily as needed for cough. Do not take with alcohol or while driving or operating heavy machinery.  May cause drowsiness. 03/04/23   Wilhemena Harbour, NP  cetirizine  (ZYRTEC  ALLERGY) 10 MG tablet Take 1 tablet (10 mg total) by mouth daily. 12/03/22   Adolph Hoop, PA-C  citalopram  (CELEXA ) 20 MG tablet Take 1 tablet (20 mg total) by mouth daily. 01/21/22   Corbin Dess, PA-C   promethazine -dextromethorphan (PROMETHAZINE -DM) 6.25-15 MG/5ML syrup Take 5 mLs by mouth at bedtime as needed for cough. Do not take with alcohol or while driving or operating heavy machinery.  May cause drowsiness. 03/04/23   Wilhemena Harbour, NP      Allergies    Patient has no known allergies.    Review of Systems   Review of Systems  Constitutional:  Negative for fever.  Cardiovascular:  Positive for leg swelling.  Musculoskeletal:  Positive for arthralgias and joint swelling. Negative for myalgias.  Skin: Negative.   Neurological:  Negative for weakness and numbness.    Physical Exam Updated Vital Signs BP 124/81 (BP Location: Left Arm)   Pulse 79   Temp 97.9 F (36.6 C) (Oral)   Resp 18   Ht 5\' 4"  (1.626 m)   Wt (!) 145.2 kg   SpO2 98%   BMI 54.93 kg/m  Physical Exam Vitals and nursing note reviewed.  Constitutional:      Appearance: She is well-developed.  HENT:     Head: Normocephalic and atraumatic.  Eyes:     Conjunctiva/sclera: Conjunctivae normal.  Cardiovascular:     Rate and Rhythm: Normal rate and regular rhythm.     Heart sounds: Normal heart sounds.  Pulmonary:     Effort: Pulmonary effort is normal.     Breath sounds: Normal breath sounds. No wheezing.  Abdominal:  General: Bowel sounds are normal.     Palpations: Abdomen is soft.     Tenderness: There is no abdominal tenderness.  Musculoskeletal:        General: Tenderness present. Normal range of motion.     Cervical back: Normal range of motion.     Comments: There is mild tenderness to palpation to the right posterior calf with subtle edema in comparison to the left lower leg.  There is no pitting edema.  She has equal and full dorsalis pedal pulses.  No palpable cords.  She has no pain or tenderness in her right thigh.  Skin:    General: Skin is warm and dry.  Neurological:     Mental Status: She is alert.     ED Results / Procedures / Treatments   Labs (all labs ordered are  listed, but only abnormal results are displayed) Labs Reviewed  BASIC METABOLIC PANEL WITH GFR - Abnormal; Notable for the following components:      Result Value   Glucose, Bld 102 (*)    BUN 24 (*)    All other components within normal limits  TSH - Abnormal; Notable for the following components:   TSH 107.191 (*)    All other components within normal limits  CBC  CBG MONITORING, ED    EKG None  Radiology No results found.  Results for orders placed or performed during the hospital encounter of 04/27/24  Basic metabolic panel   Collection Time: 04/27/24 10:04 AM  Result Value Ref Range   Sodium 137 135 - 145 mmol/L   Potassium 4.0 3.5 - 5.1 mmol/L   Chloride 101 98 - 111 mmol/L   CO2 26 22 - 32 mmol/L   Glucose, Bld 102 (H) 70 - 99 mg/dL   BUN 24 (H) 6 - 20 mg/dL   Creatinine, Ser 1.91 0.44 - 1.00 mg/dL   Calcium 9.1 8.9 - 47.8 mg/dL   GFR, Estimated >29 >56 mL/min   Anion gap 10 5 - 15  TSH   Collection Time: 04/27/24 10:04 AM  Result Value Ref Range   TSH 107.191 (H) 0.350 - 4.500 uIU/mL  CBC   Collection Time: 04/27/24 10:04 AM  Result Value Ref Range   WBC 7.0 4.0 - 10.5 K/uL   RBC 4.35 3.87 - 5.11 MIL/uL   Hemoglobin 12.7 12.0 - 15.0 g/dL   HCT 21.3 08.6 - 57.8 %   MCV 87.1 80.0 - 100.0 fL   MCH 29.2 26.0 - 34.0 pg   MCHC 33.5 30.0 - 36.0 g/dL   RDW 46.9 62.9 - 52.8 %   Platelets 279 150 - 400 K/uL   nRBC 0.0 0.0 - 0.2 %  CBG monitoring, ED   Collection Time: 04/27/24 10:57 AM  Result Value Ref Range   Glucose-Capillary 96 70 - 99 mg/dL   US  Venous Img Lower Right (DVT Study) Result Date: 04/27/2024 CLINICAL DATA:  Right calf pain and swelling EXAM: RIGHT LOWER EXTREMITY VENOUS DOPPLER ULTRASOUND TECHNIQUE: Gray-scale sonography with graded compression, as well as color Doppler and duplex ultrasound were performed to evaluate the lower extremity deep venous systems from the level of the common femoral vein and including the common femoral, femoral,  profunda femoral, popliteal and calf veins including the posterior tibial, peroneal and gastrocnemius veins when visible. The superficial great saphenous vein was also interrogated. Spectral Doppler was utilized to evaluate flow at rest and with distal augmentation maneuvers in the common femoral, femoral and popliteal veins. COMPARISON:  None Available. FINDINGS: Contralateral Common Femoral Vein: Respiratory phasicity is normal and symmetric with the symptomatic side. No evidence of thrombus. Normal compressibility. Common Femoral Vein: No evidence of thrombus. Normal compressibility, respiratory phasicity and response to augmentation. Saphenofemoral Junction: No evidence of thrombus. Normal compressibility and flow on color Doppler imaging. Profunda Femoral Vein: No evidence of thrombus. Normal compressibility and flow on color Doppler imaging. Femoral Vein: No evidence of thrombus. Normal compressibility, respiratory phasicity and response to augmentation. Popliteal Vein: No evidence of thrombus. Normal compressibility, respiratory phasicity and response to augmentation. Calf Veins: The peroneal veins are non visualized on color Doppler imaging despite augmentation. Suspect isolated calf DVT. Superficial Great Saphenous Vein: No evidence of thrombus. Normal compressibility. Venous Reflux:  None. Other Findings:  None. IMPRESSION: Examination is suspicious for isolated calf DVT involving the right peroneal veins. Electronically Signed   By: Fernando Hoyer M.D.   On: 04/27/2024 13:42    Procedures Procedures    Medications Ordered in ED Medications - No data to display  ED Course/ Medical Decision Making/ A&P                                 Medical Decision Making Patient presenting with pain in her right calf with risk factors for DVT including sedentary activities.  Her ultrasound is positive for a small peroneal DVT.  Additional labs obtained including a TSH as patient has been out of her  Levothyroid for the last several months surprisingly significantly elevated.  She is placed back on her thyroid  medication, she is also started on Eliquis  and was given a 30-day starter pack prescription, pharmacy brought her a coupon in order to obtain this.  We also discussed home treatment including elevation, gentle heating pad for no more than 20-minute increments.  She may use Tylenol  if needed for discomfort, advised against NSAIDs while she is on Eliquis .  We also discussed role of Eliquis  and her need to use caution regarding falls while on this medication.  Patient is referred to primary care to establish primary care, she was also referred to the DVT clinic with [facility].  She understands need to arrange appointment with them.  Amount and/or Complexity of Data Reviewed Labs: ordered.    Details: Labs reviewed including CBC, B met, CBG and TSH, reassuringly normal except for her TSH which is unsurprisingly elevated at 107. Radiology: ordered.    Details: Ultrasound obtained confirming small DVT of the peroneal nerve.  Risk Prescription drug management.           Final Clinical Impression(s) / ED Diagnoses Final diagnoses:  Acute deep vein thrombosis (DVT) of right peroneal vein (HCC)  Medication refill  Hypothyroidism, unspecified type    Rx / DC Orders ED Discharge Orders          Ordered    APIXABAN  (ELIQUIS ) VTE STARTER PACK (10MG  AND 5MG )       Note to Pharmacy: If starter pack unavailable, substitute with seventy-four 5 mg apixaban  tabs following the above SIG directions.   04/27/24 1428    levothyroxine  (SYNTHROID ) 175 MCG tablet  Daily before breakfast        04/27/24 1428              Katriona Schmierer, PA-C 04/30/24 1359    Mozell Arias, MD 05/01/24 [phone]

## 2024-04-27 NOTE — ED Triage Notes (Signed)
 Pt ambulatory to er room number 11, pt has swelling to R leg, states that since Thursday she has been having some pain and swelling in her leg, states that she sits at work and just got back from a car trip to the beach, pt states that she took some pain med with relief.

## 2024-04-27 NOTE — Discharge Instructions (Addendum)
Information on my medicine - ELIQUIS (apixaban)  This medication education was reviewed with me or my healthcare representative as part of my discharge preparation.  The pharmacist that spoke with me during my hospital stay was:  Layken Doenges C Lebert Lovern, RPH  Why was Eliquis prescribed for you? Eliquis was prescribed to treat blood clots that may have been found in the veins of your legs (deep vein thrombosis) or in your lungs (pulmonary embolism) and to reduce the risk of them occurring again.  What do You need to know about Eliquis ? The starting dose is 10 mg (two 5 mg tablets) taken TWICE daily for the FIRST SEVEN (7) DAYS, then  the dose is reduced to ONE 5 mg tablet taken TWICE daily.  Eliquis may be taken with or without food.   Try to take the dose about the same time in the morning and in the evening. If you have difficulty swallowing the tablet whole please discuss with your pharmacist how to take the medication safely.  Take Eliquis exactly as prescribed and DO NOT stop taking Eliquis without talking to the doctor who prescribed the medication.  Stopping may increase your risk of developing a new blood clot.  Refill your prescription before you run out.  After discharge, you should have regular check-up appointments with your healthcare provider that is prescribing your Eliquis.    What do you do if you miss a dose? If a dose of ELIQUIS is not taken at the scheduled time, take it as soon as possible on the same day and twice-daily administration should be resumed. The dose should not be doubled to make up for a missed dose.  Important Safety Information A possible side effect of Eliquis is bleeding. You should call your healthcare provider right away if you experience any of the following: ? Bleeding from an injury or your nose that does not stop. ? Unusual colored urine (red or dark brown) or unusual colored stools (red or black). ? Unusual bruising for unknown reasons. ? A  serious fall or if you hit your head (even if there is no bleeding).  Some medicines may interact with Eliquis and might increase your risk of bleeding or clotting while on Eliquis. To help avoid this, consult your healthcare provider or pharmacist prior to using any new prescription or non-prescription medications, including herbals, vitamins, non-steroidal anti-inflammatory drugs (NSAIDs) and supplements.  This website has more information on Eliquis (apixaban): http://www.eliquis.com/eliquis/home  

## 2024-05-21 NOTE — Progress Notes (Deleted)
 DVT Clinic Note  Name: Julie Chambers     MRN: [id number]     DOB: 04-11-1981     Sex: female  PCP: Pcp, No  Today's Visit:    Referred to DVT Clinic by: Emergency Department - Dr. Patsey Referred to CPP by: {CPP Referral Provider:28391} Reason for referral: No chief complaint on file.  HISTORY OF PRESENT ILLNESS: Julie Chambers is a 43 y.o. female with PMH asthma, hypothyroidism, who presents for follow up medication management after diagnosis of DVT. She presented to the ED 04/27/24 reporting right calf swelling and tightness ongoing for 5 days. Ultrasound showed DVT in the right peroneal veins, and she was started on Eliquis . Today, patient reports ***. *** abnormal bleeding or bruising. *** missed doses of ***. *** wearing compression stockings.           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 happened in high school and again as young adul no surgery only physical therapy   Spontaneous abortion    Tremor of left hand     Past Surgical History:  Procedure Laterality Date   LAPAROSCOPIC TUBAL LIGATION Bilateral 12/15/2015   Procedure: LAPAROSCOPIC BILATERAL TUBAL LIGATION;  Surgeon: Nathanel Bunker, MD;  Location: [facility];  Service: Gynecology;  Laterality: Bilateral;   LITHOTRIPSY     MOLE REMOVAL     skin precancer   TUBAL LIGATION      Social History   Socioeconomic History   Marital status: Married    Spouse name: Not on file   Number of children: 2   Years of education: Not on file   Highest education level: Not on file  Occupational History   Not on file  Tobacco Use   Smoking status: Never   Smokeless tobacco: Never  Vaping Use   Vaping status: Never Used   Substance and Sexual Activity   Alcohol use: Yes    Alcohol/week: 0.0 standard drinks of alcohol    Comment: occasionally    Drug use: No   Sexual activity: Yes    Partners: Male    Birth control/protection: Condom  Other Topics Concern   Not on file  Social History Narrative   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   Married.    Has two children 23/32 years old.   Eats all food groups.   Enjoys Architect.    Wears seatbelt.   No exercise.   Has been fostering dogs and is adopting one of the dogs.    Social Drivers of Corporate investment banker Strain: Low Risk  (04/16/2018)   Overall Financial Resource Strain (CARDIA)    Difficulty of Paying Living Expenses: Not hard at all  Food Insecurity: Food Insecurity Present (04/16/2018)   Hunger Vital Sign    Worried About Running Out of Food in the Last Year: Sometimes true    Ran Out of Food in the Last Year: Sometimes true  Transportation Needs: No Transportation Needs (04/16/2018)   PRAPARE - Administrator, Civil Service (Medical): No    Lack of Transportation (Non-Medical): No  Physical Activity: Inactive (04/16/2018)   Exercise Vital Sign    Days of  Exercise per Week: 0 days    Minutes of Exercise per Session: 0 min  Stress: Stress Concern Present (04/16/2018)   [employer] of Occupational Health - Occupational Stress Questionnaire    Feeling of Stress : To some extent  Social Connections: Somewhat Isolated (04/16/2018)   Social Connection and Isolation Panel    Frequency of Communication with Friends and Family: More than three times a week    Frequency of Social Gatherings with Friends and Family: Once a week    Attends Religious Services: Never    Database administrator or Organizations: No    Attends Banker Meetings: Never    Marital Status: Married  Catering manager Violence: Not At Risk (04/16/2018)   Humiliation, Afraid, Rape, and Kick questionnaire    Fear  of Current or Ex-Partner: No    Emotionally Abused: No    Physically Abused: No    Sexually Abused: No    Family History  Problem Relation Age of Onset   Depression Mother    Cancer Father        non hodgkins lymphoma   Mental illness Sister        ocd   Cancer Brother        neuroblastoma   Diabetes Maternal Grandmother    COPD Maternal Grandmother    Hypertension Maternal Grandmother    Anesthesia problems Neg Hx    Hypotension Neg Hx    Pseudochol deficiency Neg Hx    Malignant hyperthermia Neg Hx     Allergies as of 05/24/2024   (No Known Allergies)    Current Outpatient Medications on File Prior to Visit  Medication Sig Dispense Refill   albuterol  (VENTOLIN  HFA) 108 (90 Base) MCG/ACT inhaler INHALE TWO PUFFS BY MOUTH EVERY FOUR HOURS AS NEEDED FOR WHEEZING OR SHORTNESS OF BREATH 18 each 0   APIXABAN  (ELIQUIS ) VTE STARTER PACK (10MG  AND 5MG ) Take as directed on package: start with two-5mg  tablets twice daily for 7 days. On day 8, switch to one-5mg  tablet twice daily. 74 each 0   benzonatate  (TESSALON ) 100 MG capsule Take 1 capsule (100 mg total) by mouth 3 (three) times daily as needed for cough. Do not take with alcohol or while driving or operating heavy machinery.  May cause drowsiness. 21 capsule 0   cetirizine  (ZYRTEC  ALLERGY) 10 MG tablet Take 1 tablet (10 mg total) by mouth daily. 30 tablet 0   citalopram  (CELEXA ) 20 MG tablet Take 1 tablet (20 mg total) by mouth daily. 90 tablet 0   levothyroxine  (SYNTHROID ) 175 MCG tablet Take 1 tablet (175 mcg total) by mouth daily before breakfast. 30 tablet 0   promethazine -dextromethorphan (PROMETHAZINE -DM) 6.25-15 MG/5ML syrup Take 5 mLs by mouth at bedtime as needed for cough. Do not take with alcohol or while driving or operating heavy machinery.  May cause drowsiness. 200 mL 0   No current facility-administered medications on file prior to visit.   REVIEW OF SYSTEMS:  ROS PHYSICAL EXAMINATION:  There were no vitals filed  for this visit.  There is no height or weight on file to calculate BMI.  Physical Exam Villalta Score for Post-Thrombotic Syndrome:    LABS:  CBC     Component Value Date/Time   WBC 7.0 04/27/2024 1004   RBC 4.35 04/27/2024 1004   HGB 12.7 04/27/2024 1004   HCT 37.9 04/27/2024 1004   PLT 279 04/27/2024 1004   MCV 87.1 04/27/2024 1004   MCV 84.1 08/29/2014 0931  MCH 29.2 04/27/2024 1004   MCHC 33.5 04/27/2024 1004   RDW 15.1 04/27/2024 1004   LYMPHSABS 3.5 12/08/2018 2120   MONOABS 0.7 12/08/2018 2120   EOSABS 0.4 12/08/2018 2120   BASOSABS 0.1 12/08/2018 2120    Hepatic Function      Component Value Date/Time   PROT 7.2 10/02/2015 1853   ALBUMIN 4.1 10/02/2015 1853   AST 17 10/02/2015 1853   ALT 24 10/02/2015 1853   ALKPHOS 58 10/02/2015 1853   BILITOT 0.3 10/02/2015 1853    Renal Function   Lab Results  Component Value Date   CREATININE 0.81 04/27/2024   CREATININE 0.70 12/08/2018   CREATININE 0.58 04/16/2018    CrCl cannot be calculated (Patient's most recent lab result is older than the maximum 21 days allowed.).   Imaging Studies:  04/27/24 doppler FINDINGS: Contralateral Common Femoral Vein: Respiratory phasicity is normal and symmetric with the symptomatic side. No evidence of thrombus. Normal compressibility.   Common Femoral Vein: No evidence of thrombus. Normal compressibility, respiratory phasicity and response to augmentation.   Saphenofemoral Junction: No evidence of thrombus. Normal compressibility and flow on color Doppler imaging.   Profunda Femoral Vein: No evidence of thrombus. Normal compressibility and flow on color Doppler imaging.   Femoral Vein: No evidence of thrombus. Normal compressibility, respiratory phasicity and response to augmentation.   Popliteal Vein: No evidence of thrombus. Normal compressibility, respiratory phasicity and response to augmentation.   Calf Veins: The peroneal veins are non visualized on color  Doppler imaging despite augmentation. Suspect isolated calf DVT.   Superficial Great Saphenous Vein: No evidence of thrombus. Normal compressibility.   Venous Reflux:  None.   Other Findings:  None.   IMPRESSION: Examination is suspicious for isolated calf DVT involving the right peroneal veins.  ASSESSMENT:    Patient without prior history of DVT diagnosed with ***.   PLAN: {DVT Clinic Eojw:71604}  Follow up: ***  Lum Herald, PharmD, BCACP, CPP Deep Vein Thrombosis Clinic Clinical Pharmacist Practitioner [phone]

## 2024-05-24 ENCOUNTER — Ambulatory Visit: Admitting: Student-PharmD

## 2024-05-26 NOTE — Progress Notes (Unsigned)
 DVT Clinic Note  Name: Julie Chambers     MRN: [id number]     DOB: [birth year]     Sex: female  PCP: Pcp, No  Today's Visit: Visit Information: Initial Visit  Referred to DVT Clinic by: Emergency Department - Dr. Patsey Referred to CPP by: Dr. Lanis Reason for referral:  Chief Complaint  Patient presents with   Med Management - DVT   HISTORY OF PRESENT ILLNESS: Julie Chambers is a 43 y.o. female with PMH asthma, hypothyroidism, who presents for follow up medication management after diagnosis of DVT. She presented to the ED 04/27/24 reporting right calf swelling and tightness ongoing for 5 days. Ultrasound showed DVT in the right peroneal veins, and she was started on Eliquis . Today, patient reports that she started taking Eliquis  after she left the ED but began to bruise easily and stopped taking it after about a week. It's been about 3 weeks since she took it. Denies history of DVT. Reports history of blood clots in father but unsure of the kind. Endorses a fall at work with injury to the leg within the month prior to DVT symptom onset. 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 Since DVT diagnosis she has tried to increase movement and has started trying to lose weight, is now down about 14 lbs. Endorses improvement in swelling since leaving the ED but has not resolved. Reports mild cramping, pain, and tingling in the right leg, which has improved compared to the severe cramping she experienced that initially brought her to the ED. Denies chest pain, SOB.   Positive Thrombotic Risk Factors: Recent trauma (within 3 months), Obesity, Other (comment) (sedentary job/lifestyle) Bleeding Risk Factors: None Present  Negative Thrombotic Risk Factors: Previous VTE, Recent surgery (within 3 months), Recent admission to hospital with acute illness (within 3 months), Paralysis, paresis, or recent plaster cast immobilization of lower extremity, Central  venous catheterization, Bed rest >72 hours within 3 months, Sedentary journey lasting >8 hours within 4 weeks, Pregnancy, Within 6 weeks postpartum, Recent cesarean section (within 3 months), Estrogen therapy, Testosterone therapy, Erythropoiesis-stimulating agent, Recent COVID diagnosis (within 3 months), Active cancer, Non-malignant, chronic inflammatory condition, Known thrombophilic condition, Smoking, Older age  Rx Insurance Coverage: Commercial Rx Assistance Provided:  Free 30-day trial card Co-pay card  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 happened in high school and again as young adul no surgery only physical therapy   Spontaneous abortion    Tremor of left hand     Past Surgical History:  Procedure Laterality Date   LAPAROSCOPIC TUBAL LIGATION Bilateral 12/15/2015   Procedure: LAPAROSCOPIC BILATERAL TUBAL LIGATION;  Surgeon: Nathanel Bunker, MD;  Location: [facility];  Service: Gynecology;  Laterality: Bilateral;   LITHOTRIPSY     MOLE REMOVAL     skin precancer   TUBAL LIGATION      Social History   Socioeconomic History   Marital status: Married    Spouse name: Not on file   Number of children: 2   Years of education: Not on file   Highest education level: Not on file  Occupational History   Not on file  Tobacco Use   Smoking status: Never   Smokeless tobacco: Never  Vaping Use   Vaping status: Never Used  Substance and Sexual Activity   Alcohol use: Yes    Alcohol/week: 0.0 standard drinks of alcohol    Comment: occasionally    Drug use: No   Sexual activity: Yes    Partners: Male    Birth control/protection: Condom  Other Topics Concern   Not on file  Social History Narrative   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   Married.    Has two children 19/19 years old.   Eats all food groups.    Enjoys Architect.    Wears seatbelt.   No exercise.   Has been fostering dogs and is adopting one of the dogs.    Social Drivers of Corporate investment banker Strain: Low Risk  (04/16/2018)   Overall Financial Resource Strain (CARDIA)    Difficulty of Paying Living Expenses: Not hard at all  Food Insecurity: Food Insecurity Present (04/16/2018)   Hunger Vital Sign    Worried About Running Out of Food in the Last Year: Sometimes true    Ran Out of Food in the Last Year: Sometimes true  Transportation Needs: No Transportation Needs (04/16/2018)   PRAPARE - Administrator, Civil Service (Medical): No    Lack of Transportation (Non-Medical): No  Physical Activity: Inactive (04/16/2018)   Exercise Vital Sign    Days of Exercise per Week: 0 days    Minutes of Exercise per Session: 0 min  Stress: Stress Concern Present (04/16/2018)   [employer] of Occupational Health - Occupational Stress Questionnaire    Feeling of Stress : To some extent  Social Connections: Somewhat Isolated (04/16/2018)   Social Connection and Isolation Panel    Frequency of Communication with Friends and Family: More than three times a week    Frequency of Social Gatherings with Friends and Family: Once a week    Attends Religious Services: Never    Database administrator or Organizations: No    Attends Banker Meetings: Never    Marital Status: Married  Catering manager Violence: Not At Risk (04/16/2018)   Humiliation, Afraid, Rape, and Kick questionnaire    Fear of Current or Ex-Partner: No    Emotionally Abused: No    Physically Abused: No    Sexually Abused: No    Family History  Problem Relation Age of Onset   Depression Mother    Cancer Father        non hodgkins lymphoma   Mental illness Sister        ocd   Cancer Brother        neuroblastoma   Diabetes Maternal Grandmother    COPD Maternal Grandmother    Hypertension Maternal Grandmother    Anesthesia  problems Neg Hx    Hypotension Neg Hx    Pseudochol deficiency Neg Hx    Malignant hyperthermia Neg Hx     Allergies as of 05/27/2024   (No Known Allergies)    Current Outpatient Medications on File Prior to Visit  Medication Sig Dispense Refill   albuterol  (VENTOLIN  HFA) 108 (90 Base) MCG/ACT inhaler INHALE TWO PUFFS BY MOUTH EVERY FOUR HOURS AS NEEDED FOR WHEEZING OR SHORTNESS OF BREATH 18 each 0   citalopram  (CELEXA ) 20 MG tablet Take 1 tablet (20 mg total) by mouth daily. 90 tablet 0   levothyroxine  (SYNTHROID ) 175 MCG tablet Take 1 tablet (175 mcg total) by mouth daily before breakfast. 30 tablet 0  APIXABAN  (ELIQUIS ) VTE STARTER PACK (10MG  AND 5MG ) Take as directed on package: start with two-5mg  tablets twice daily for 7 days. On day 8, switch to one-5mg  tablet twice daily. (Patient not taking: Reported on 05/27/2024) 74 each 0   No current facility-administered medications on file prior to visit.   REVIEW OF SYSTEMS:  Review of Systems  Respiratory:  Negative for shortness of breath.   Cardiovascular:  Positive for leg swelling. Negative for chest pain and palpitations.  Musculoskeletal:  Positive for myalgias.  Neurological:  Positive for tingling. Negative for dizziness.   PHYSICAL EXAMINATION:  Vitals:   05/27/24 1043  BP: (!) 133/91  Pulse: 90  SpO2: 95%  Weight: (!) 318 lb 11.2 oz (144.6 kg)    Body mass index is 54.7 kg/m.  Physical Exam Vitals reviewed.   Cardiovascular:     Rate and Rhythm: Normal rate.  Pulmonary:     Effort: Pulmonary effort is normal.   Musculoskeletal:        General: Tenderness (mild) present.     Right lower leg: Edema (2+) present.     Left lower leg: Edema (1+) present.   Skin:    Findings: No bruising or erythema.   Psychiatric:        Mood and Affect: Mood normal.        Behavior: Behavior normal.        Thought Content: Thought content normal.   Villalta Score for Post-Thrombotic Syndrome: Pain: Mild Cramps:  Mild Heaviness: Absent Paresthesia: Mild Pruritus: Absent Pretibial Edema: Moderate Skin Induration: Absent Hyperpigmentation: Absent Redness: Absent Venous Ectasia: Absent Pain on calf compression: Mild Villalta Preliminary Score: 6 Is venous ulcer present?: No If venous ulcer is present and score is <15, then 15 points total are assigned: Absent Villalta Total Score: 6  LABS:  CBC     Component Value Date/Time   WBC 7.0 04/27/2024 1004   RBC 4.35 04/27/2024 1004   HGB 12.7 04/27/2024 1004   HCT 37.9 04/27/2024 1004   PLT 279 04/27/2024 1004   MCV 87.1 04/27/2024 1004   MCV 84.1 08/29/2014 0931   MCH 29.2 04/27/2024 1004   MCHC 33.5 04/27/2024 1004   RDW 15.1 04/27/2024 1004   LYMPHSABS 3.5 12/08/2018 2120   MONOABS 0.7 12/08/2018 2120   EOSABS 0.4 12/08/2018 2120   BASOSABS 0.1 12/08/2018 2120    Hepatic Function      Component Value Date/Time   PROT 7.2 10/02/2015 1853   ALBUMIN 4.1 10/02/2015 1853   AST 17 10/02/2015 1853   ALT 24 10/02/2015 1853   ALKPHOS 58 10/02/2015 1853   BILITOT 0.3 10/02/2015 1853    Renal Function   Lab Results  Component Value Date   CREATININE 0.81 04/27/2024   CREATININE 0.70 12/08/2018   CREATININE 0.58 04/16/2018    CrCl cannot be calculated (Patient's most recent lab result is older than the maximum 21 days allowed.).   Imaging Studies:  04/27/24 doppler FINDINGS: Contralateral Common Femoral Vein: Respiratory phasicity is normal and symmetric with the symptomatic side. No evidence of thrombus. Normal compressibility.   Common Femoral Vein: No evidence of thrombus. Normal compressibility, respiratory phasicity and response to augmentation.   Saphenofemoral Junction: No evidence of thrombus. Normal compressibility and flow on color Doppler imaging.   Profunda Femoral Vein: No evidence of thrombus. Normal compressibility and flow on color Doppler imaging.   Femoral Vein: No evidence of thrombus. Normal  compressibility, respiratory phasicity and response to augmentation.   Popliteal  Vein: No evidence of thrombus. Normal compressibility, respiratory phasicity and response to augmentation.   Calf Veins: The peroneal veins are non visualized on color Doppler imaging despite augmentation. Suspect isolated calf DVT.   Superficial Great Saphenous Vein: No evidence of thrombus. Normal compressibility.   Venous Reflux:  None.   Other Findings:  None.   IMPRESSION: Examination is suspicious for isolated calf DVT involving the right peroneal veins.  ASSESSMENT: Location of DVT: Right distal vein Cause of DVT: provoked by a transient risk factor  Patient without prior history of DVT who had an ultrasound on 04/27/24 in the [facility] ED that was suspicious for right peroneal DVT but the veins were not well visualized. She was started on Eliquis  and referred to DVT Clinic for follow up. She stopped taking Eliquis  after a few days due to experiencing bruising and wanted to wait until she saw a specialist to decide what to do. Her swelling has improved but not entirely resolved. Discussed with Dr. Lanis in the office today. Since the previous study was not conclusive for DVT and the patient has stopped taking anticoagulation, will repeat her ultrasound in our vascular lab tomorrow afternoon per patient availability. If this shows any evidence of current or recent DVT, will have her take Eliquis  for a total of 3 months. Will call her tomorrow with next steps after the ultrasound.   PLAN: -Return tomorrow for ultrasound. -Educated patient to present to the ED if emergent signs and symptoms of new thrombosis occur. -Counseled patient to wear compression stockings daily, removing at night. Counseled on proper leg elevation.   Follow up: Will follow results of ultrasound tomorrow and call patient with plan.   Lum Herald, PharmD, [place], CPP Deep Vein Thrombosis Clinic Clinical Pharmacist  Practitioner [phone]  I have evaluated the patient's chart/imaging and refer this patient to the Clinical Pharmacist Practitioner for medication management. I have reviewed the CPP's documentation and agree with her assessment and plan. I was immediately available during the visit for questions and collaboration.   Fonda FORBES Lanis, MD

## 2024-05-27 ENCOUNTER — Encounter (HOSPITAL_COMMUNITY)

## 2024-05-27 ENCOUNTER — Encounter: Payer: Self-pay | Admitting: Student-PharmD

## 2024-05-27 ENCOUNTER — Ambulatory Visit: Attending: Vascular Surgery | Admitting: Student-PharmD

## 2024-05-27 VITALS — BP 133/91 | HR 90 | Wt 318.7 lb

## 2024-05-27 DIAGNOSIS — I82451 Acute embolism and thrombosis of right peroneal vein: Secondary | ICD-10-CM

## 2024-05-27 NOTE — Patient Instructions (Signed)
 We are going to rescan your leg to assess for DVT, and I will call you after with the result/plan.   If you have any questions or need to reschedule an appointment, please call [phone]. If you are having an emergency, call 911 or present to the nearest emergency room.   What is a DVT?  -Deep vein thrombosis (DVT) is a condition in which a blood clot forms in a vein of the deep venous system which can occur in the lower leg, thigh, pelvis, arm, or neck. This condition is serious and can be life-threatening if the clot travels to the arteries of the lungs and causing a blockage (pulmonary embolism, PE). A DVT can also damage veins in the leg, which can lead to long-term venous disease, leg pain, swelling, discoloration, and ulcers or sores (post-thrombotic syndrome).  -Treatment may include taking an anticoagulant medication to prevent more clots from forming and the current clot from growing, wearing compression stockings, and/or surgical procedures to remove or dissolve the clot.

## 2024-05-28 ENCOUNTER — Ambulatory Visit (HOSPITAL_COMMUNITY)

## 2024-05-31 ENCOUNTER — Ambulatory Visit (HOSPITAL_COMMUNITY)
Admission: RE | Admit: 2024-05-31 | Discharge: 2024-05-31 | Disposition: A | Discharge status: Home / Self Care | Source: Ambulatory Visit | Attending: Vascular Surgery | Admitting: Vascular Surgery

## 2024-05-31 DIAGNOSIS — I82451 Acute embolism and thrombosis of right peroneal vein: Secondary | ICD-10-CM | POA: Diagnosis not present

## 2024-06-01 ENCOUNTER — Telehealth: Payer: Self-pay | Admitting: Student-PharmD

## 2024-06-01 MED ORDER — APIXABAN 5 MG PO TABS: 5.0000 mg | ORAL_TABLET | Freq: Two times a day (BID) | ORAL | 1 refills | Status: AC

## 2024-06-01 NOTE — Telephone Encounter (Signed)
 Patient seen in DVT Clinic 05/27/24 after an ultrasound in the ED 04/27/24 mentioned there was suspicion for peroneal DVT and she was started on Eliquis . Due to these veins not being visualized and the patient having stopped taking Eliquis  due to concerns after reading about side effects, Dr. Lanis recommended repeating the ultrasound in our vascular lab to see if we could gain any additional clarity on a DVT diagnosis and whether treatment should be indicated. This was completed yesterday, and I reviewed the results with Dr. Magda today. This showed no propagation which is encouraging but we could still not visualize both paired peroneal veins due to body habitus. The one that was visualized was compressible. Per Dr. Magda, it's difficult to rule out the presence of DVT from one month ago since the peroneal veins were not fully visualized then or on repeat yesterday. In light of her symptoms being consistent with DVT and not yet resolving, he recommended anticoagulating for 3 months as the safest option. If the patient refused anticoagulation, since it's only presumed in the calf vein and has not propagated yet, could consider aspirin with another repeat study in a few weeks to confirm there is still no propagation. Discussed with patient and she prefers to restart Eliquis  as she feels more comfortable taking it after discussions in clinic last week about how to take it and what to watch for. Sent in refills to complete 3 months of Eliquis  to her pharmacy. Encouraged PCP follow up. DVT Clinic available as needed.

## 2024-06-08 DIAGNOSIS — F419 Anxiety disorder, unspecified: Secondary | ICD-10-CM | POA: Diagnosis not present

## 2024-07-06 ENCOUNTER — Ambulatory Visit: Payer: Self-pay

## 2024-07-06 VITALS — BP 136/85 | HR 94 | Ht 64.0 in | Wt 313.0 lb

## 2024-07-06 DIAGNOSIS — F32A Depression, unspecified: Secondary | ICD-10-CM

## 2024-07-06 DIAGNOSIS — M519 Unspecified thoracic, thoracolumbar and lumbosacral intervertebral disc disorder: Secondary | ICD-10-CM | POA: Insufficient documentation

## 2024-07-06 DIAGNOSIS — F419 Anxiety disorder, unspecified: Secondary | ICD-10-CM | POA: Diagnosis not present

## 2024-07-06 DIAGNOSIS — I82451 Acute embolism and thrombosis of right peroneal vein: Secondary | ICD-10-CM

## 2024-07-06 DIAGNOSIS — Z131 Encounter for screening for diabetes mellitus: Secondary | ICD-10-CM | POA: Diagnosis not present

## 2024-07-06 DIAGNOSIS — Z85828 Personal history of other malignant neoplasm of skin: Secondary | ICD-10-CM | POA: Insufficient documentation

## 2024-07-06 DIAGNOSIS — J4520 Mild intermittent asthma, uncomplicated: Secondary | ICD-10-CM | POA: Insufficient documentation

## 2024-07-06 DIAGNOSIS — E039 Hypothyroidism, unspecified: Secondary | ICD-10-CM | POA: Diagnosis not present

## 2024-07-06 DIAGNOSIS — Z1159 Encounter for screening for other viral diseases: Secondary | ICD-10-CM | POA: Diagnosis not present

## 2024-07-06 DIAGNOSIS — R251 Tremor, unspecified: Secondary | ICD-10-CM

## 2024-07-06 MED ORDER — LEVOTHYROXINE SODIUM 175 MCG PO TABS: 175.0000 ug | ORAL_TABLET | Freq: Every day | ORAL | 1 refills | Status: AC

## 2024-07-06 MED ORDER — CITALOPRAM HYDROBROMIDE 20 MG PO TABS: 20.0000 mg | ORAL_TABLET | Freq: Every day | ORAL | 1 refills | Status: AC

## 2024-07-06 NOTE — Progress Notes (Signed)
 New Patient Office Visit  Subjective    Patient ID: Julie Chambers, female    DOB: 1981-02-13  Age: 43 y.o. MRN: [id number]  CC:  Chief Complaint  Patient presents with   Establish Care    Establish care    HPI Julie Chambers presents to establish care Julie Chambers is a 43 y.o. female with PMH asthma, hypothyroidism, who presents to establish care after recent diagnosis of DVT. She presented to the ED 04/27/24 reporting right calf swelling and tightness ongoing for 5 days. Ultrasound showed DVT in the right peroneal veins, and she was started on Eliquis .  She had follow-up visit with vascular in June for this issue.  She states that she has not been taking the Eliquis  consistently.  She is advised to stay on it for 3 months.  She denies history of DVT. Reports history of blood clots in father but unsure of the kind. Endorses a fall at work with injury to the leg within the month prior to DVT symptom onset. 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 Since DVT diagnosis she has tried to increase movement and has started trying to lose weight, is now down about 14 lbs. She denies any chest pain or SOB at this time.  She does report tremor in her right hand and which her mom and brother also have.  She is very concerned about neurological disorders given her family history.  Outpatient Encounter Medications as of 07/06/2024  Medication Sig   albuterol  (VENTOLIN  HFA) 108 (90 Base) MCG/ACT inhaler INHALE TWO PUFFS BY MOUTH EVERY FOUR HOURS AS NEEDED FOR WHEEZING OR SHORTNESS OF BREATH   apixaban  (ELIQUIS ) 5 MG TABS tablet Take 1 tablet (5 mg total) by mouth 2 (two) times daily.   [DISCONTINUED] citalopram  (CELEXA ) 20 MG tablet Take 1 tablet (20 mg total) by mouth daily.   [DISCONTINUED] levothyroxine  (SYNTHROID ) 175 MCG tablet Take 1 tablet (175 mcg total) by mouth daily before breakfast.   citalopram  (CELEXA ) 20 MG tablet Take 1 tablet (20 mg total)  by mouth daily.   levothyroxine  (SYNTHROID ) 175 MCG tablet Take 1 tablet (175 mcg total) by mouth daily before breakfast.   [DISCONTINUED] APIXABAN  (ELIQUIS ) VTE STARTER PACK (10MG  AND 5MG ) Take as directed on package: start with two-5mg  tablets twice daily for 7 days. On day 8, switch to one-5mg  tablet twice daily. (Patient not taking: Reported on 05/27/2024)   No facility-administered encounter medications on file as of 07/06/2024.    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 happened in high school and again as young adul no surgery only physical therapy   Spontaneous abortion    Tremor of left hand     Past Surgical History:  Procedure Laterality Date   LAPAROSCOPIC TUBAL LIGATION Bilateral 12/15/2015   Procedure: LAPAROSCOPIC BILATERAL TUBAL LIGATION;  Surgeon: Nathanel Bunker, MD;  Location: [facility];  Service: Gynecology;  Laterality: Bilateral;   LITHOTRIPSY     MOLE REMOVAL     skin precancer   TUBAL LIGATION      Family History  Problem Relation Age of Onset   Depression Mother    Cancer Father        non hodgkins lymphoma  Mental illness Sister        ocd   Cancer Brother        neuroblastoma   Diabetes Maternal Grandmother    COPD Maternal Grandmother    Hypertension Maternal Grandmother    Anesthesia problems Neg Hx    Hypotension Neg Hx    Pseudochol deficiency Neg Hx    Malignant hyperthermia Neg Hx     Social History   Socioeconomic History   Marital status: Married    Spouse name: Not on file   Number of children: 2   Years of education: Not on file   Highest education level: Not on file  Occupational History   Not on file  Tobacco Use   Smoking status: Never   Smokeless tobacco: Never  Vaping Use   Vaping status: Never Used  Substance and Sexual Activity   Alcohol use: Yes    Alcohol/week: 0.0  standard drinks of alcohol    Comment: occasionally    Drug use: No   Sexual activity: Yes    Partners: Male    Birth control/protection: Condom  Other Topics Concern   Not on file  Social History Narrative   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   Married.    Has two children 32/78 years old.   Eats all food groups.   Enjoys Architect.    Wears seatbelt.   No exercise.   Has been fostering dogs and is adopting one of the dogs.    Social Drivers of Corporate investment banker Strain: Low Risk  (04/16/2018)   Overall Financial Resource Strain (CARDIA)    Difficulty of Paying Living Expenses: Not hard at all  Food Insecurity: No Food Insecurity (07/06/2024)   Hunger Vital Sign    Worried About Running Out of Food in the Last Year: Never true    Ran Out of Food in the Last Year: Never true  Transportation Needs: No Transportation Needs (07/06/2024)   PRAPARE - Administrator, Civil Service (Medical): No    Lack of Transportation (Non-Medical): No  Physical Activity: Inactive (04/16/2018)   Exercise Vital Sign    Days of Exercise per Week: 0 days    Minutes of Exercise per Session: 0 min  Stress: Stress Concern Present (04/16/2018)   [employer] of Occupational Health - Occupational Stress Questionnaire    Feeling of Stress : To some extent  Social Connections: Somewhat Isolated (04/16/2018)   Social Connection and Isolation Panel    Frequency of Communication with Friends and Family: More than three times a week    Frequency of Social Gatherings with Friends and Family: Once a week    Attends Religious Services: Never    Database administrator or Organizations: No    Attends Banker Meetings: Never    Marital Status: Married  Catering manager Violence: Not At Risk (07/06/2024)   Humiliation, Afraid, Rape, and Kick questionnaire    Fear of Current or Ex-Partner: No    Emotionally Abused: No    Physically Abused: No     Sexually Abused: No    ROS      Objective    BP 136/85   Pulse 94   Ht 5' 4 (1.626 m)   Wt (!) 313 lb (142 kg)   SpO2 93%   BMI 53.73 kg/m   Physical Exam Vitals and nursing note reviewed.  Constitutional:      Appearance: Normal appearance. She is  obese.  HENT:     Head: Normocephalic.     Right Ear: Tympanic membrane, ear canal and external ear normal.     Left Ear: Tympanic membrane, ear canal and external ear normal.     Nose: Nose normal.     Mouth/Throat:     Mouth: Mucous membranes are moist.     Pharynx: Oropharynx is clear.  Eyes:     Extraocular Movements: Extraocular movements intact.     Pupils: Pupils are equal, round, and reactive to light.  Cardiovascular:     Rate and Rhythm: Normal rate and regular rhythm.  Pulmonary:     Effort: Pulmonary effort is normal.     Breath sounds: Normal breath sounds.  Abdominal:     General: Bowel sounds are normal.     Palpations: Abdomen is soft.  Musculoskeletal:     Cervical back: Normal range of motion and neck supple.  Skin:    General: Skin is warm and dry.  Neurological:     Mental Status: She is alert and oriented to person, place, and time.     Motor: Tremor (right hand) present.  Psychiatric:        Mood and Affect: Mood normal.        Thought Content: Thought content normal.     Last CBC Lab Results  Component Value Date   WBC 7.0 04/27/2024   HGB 12.7 04/27/2024   HCT 37.9 04/27/2024   MCV 87.1 04/27/2024   MCH 29.2 04/27/2024   RDW 15.1 04/27/2024   PLT 279 04/27/2024   Last metabolic panel Lab Results  Component Value Date   GLUCOSE 102 (H) 04/27/2024   NA 137 04/27/2024   K 4.0 04/27/2024   CL 101 04/27/2024   CO2 26 04/27/2024   BUN 24 (H) 04/27/2024   CREATININE 0.81 04/27/2024   GFRNONAA >60 04/27/2024   CALCIUM 9.1 04/27/2024   PROT 7.2 10/02/2015   ALBUMIN 4.1 10/02/2015   BILITOT 0.3 10/02/2015   ALKPHOS 58 10/02/2015   AST 17 10/02/2015   ALT 24 10/02/2015    ANIONGAP 10 04/27/2024   Last lipids Lab Results  Component Value Date   CHOL 170 10/02/2015   HDL 49 10/02/2015   LDLCALC 108 10/02/2015   TRIG 66 10/02/2015   CHOLHDL 3.5 10/02/2015   Last hemoglobin A1c Lab Results  Component Value Date   HGBA1C 5.4 04/16/2018   Last thyroid  functions Lab Results  Component Value Date   TSH 107.191 (H) 04/27/2024   THYROIDAB 7 04/16/2018        Assessment & Plan:   Problem List Items Addressed This Visit       Cardiovascular and Mediastinum   Deep venous thrombosis (DVT) of right peroneal vein (HCC)   She denies any increase in swelling or pain in right lower extremity.  Recommend she continue with Eliquis  for 1 more month.      Relevant Orders   CBC with Differential/Platelet     Endocrine   Hypothyroidism - Primary (Chronic)   Most recent TSH level from May was 107.19.  She reports that she had not been taking her levothyroxine  consistently, but has been recently taking regularly.  Will recheck TSH level today.  Follow-up according to lab results.      Relevant Medications   levothyroxine  (SYNTHROID ) 175 MCG tablet   Other Relevant Orders   TSH + free T4     Other   Anxiety and depression   Stable with  Celexa  40 mg.  Refill provided today.  We discussed possibility of changing to something different for weight loss efforts, but she reports OB/GYN putting her on something different and it made her feel crazy.  She is not sure what she was prescribed at that time.  Recommend we do labs and get her thyroid  levels stabilized before making any changes since she is doing well with this dose of Celexa .      Relevant Medications   citalopram  (CELEXA ) 20 MG tablet   Morbid obesity (HCC)   She states that her OB/GYN tried to get her on Wegovy last year but insurance would not cover it.  We will check fasting labs today and refer to pulmonary for sleep study.  Once we have her thyroid  level regulated and lab results available we  will attempt to obtain GLP-1 once again.  She will continue to work on weight loss efforts.      Relevant Orders   Ambulatory referral to Sleep Studies   Tremor of right hand   Ongoing for past few years.  Denies worsening of symptoms.  Her mom has similar tremor and she was told by her neurologist that it was a essential tremor.  I have agreed to refer her to neurology given tremor and reported memory issues.  She mainly needs it for peace of mind given her brothers history of brain tumor, which she died from.      Relevant Orders   Ambulatory referral to Neurology   Other Visit Diagnoses       Screening for diabetes mellitus       Check fasting labs today.  Recommend continued work on weight loss.   Relevant Orders   CMP14+EGFR   HgB A1c     Need for hepatitis C screening test       Add for screening purposes.   Relevant Orders   Hepatitis C Antibody       Return in about 4 months (around 11/05/2024) for chronic follow-up with PCP.   Leita Longs, FNP

## 2024-07-06 NOTE — Assessment & Plan Note (Signed)
 Most recent TSH level from May was 107.19.  She reports that she had not been taking her levothyroxine  consistently, but has been recently taking regularly.  Will recheck TSH level today.  Follow-up according to lab results.

## 2024-07-06 NOTE — Assessment & Plan Note (Signed)
 She denies any increase in swelling or pain in right lower extremity.  Recommend she continue with Eliquis  for 1 more month.

## 2024-07-06 NOTE — Assessment & Plan Note (Signed)
 Stable with Celexa  40 mg.  Refill provided today.  We discussed possibility of changing to something different for weight loss efforts, but she reports OB/GYN putting her on something different and it made her feel crazy.  She is not sure what she was prescribed at that time.  Recommend we do labs and get her thyroid  levels stabilized before making any changes since she is doing well with this dose of Celexa .

## 2024-07-06 NOTE — Assessment & Plan Note (Signed)
 She states that her OB/GYN tried to get her on Wegovy last year but insurance would not cover it.  We will check fasting labs today and refer to pulmonary for sleep study.  Once we have her thyroid  level regulated and lab results available we will attempt to obtain GLP-1 once again.  She will continue to work on weight loss efforts.

## 2024-07-06 NOTE — Assessment & Plan Note (Signed)
 Ongoing for past few years.  Denies worsening of symptoms.  Her mom has similar tremor and she was told by her neurologist that it was a essential tremor.  I have agreed to refer her to neurology given tremor and reported memory issues.  She mainly needs it for peace of mind given her brothers history of brain tumor, which she died from.

## 2024-07-07 LAB — HEPATITIS C ANTIBODY: Hep C Virus Ab: NONREACTIVE

## 2024-07-07 LAB — CMP14+EGFR
ALT: 24 IU/L (ref 0–32)
AST: 20 IU/L (ref 0–40)
Albumin: 4.2 g/dL (ref 3.9–4.9)
Alkaline Phosphatase: 83 IU/L (ref 44–121)
BUN/Creatinine Ratio: 29 — ABNORMAL HIGH (ref 9–23)
BUN: 20 mg/dL (ref 6–24)
Bilirubin Total: 0.3 mg/dL (ref 0.0–1.2)
CO2: 24 mmol/L (ref 20–29)
Calcium: 9.5 mg/dL (ref 8.7–10.2)
Chloride: 102 mmol/L (ref 96–106)
Creatinine, Ser: 0.68 mg/dL (ref 0.57–1.00)
Globulin, Total: 2.9 g/dL (ref 1.5–4.5)
Glucose: 94 mg/dL (ref 70–99)
Potassium: 4.3 mmol/L (ref 3.5–5.2)
Sodium: 139 mmol/L (ref 134–144)
Total Protein: 7.1 g/dL (ref 6.0–8.5)
eGFR: 112 mL/min/1.73 (ref >59)

## 2024-07-07 LAB — CBC WITH DIFFERENTIAL/PLATELET
Basophils Absolute: 0.1 x10E3/uL (ref 0.0–0.2)
Basos: 1 %
EOS (ABSOLUTE): 0.1 x10E3/uL (ref 0.0–0.4)
Eos: 2 %
Hematocrit: 42.3 % (ref 34.0–46.6)
Hemoglobin: 13.7 g/dL (ref 11.1–15.9)
Immature Grans (Abs): 0 x10E3/uL (ref 0.0–0.1)
Immature Granulocytes: 0 %
Lymphocytes Absolute: 2.2 x10E3/uL (ref 0.7–3.1)
Lymphs: 34 %
MCH: 28.7 pg (ref 26.6–33.0)
MCHC: 32.4 g/dL (ref 31.5–35.7)
MCV: 89 fL (ref 79–97)
Monocytes Absolute: 0.4 x10E3/uL (ref 0.1–0.9)
Monocytes: 7 %
Neutrophils Absolute: 3.7 x10E3/uL (ref 1.4–7.0)
Neutrophils: 56 %
Platelets: 260 x10E3/uL (ref 150–450)
RBC: 4.78 x10E6/uL (ref 3.77–5.28)
RDW: 13.4 % (ref 11.7–15.4)
WBC: 6.6 x10E3/uL (ref 3.4–10.8)

## 2024-07-07 LAB — HEMOGLOBIN A1C
Est. average glucose Bld gHb Est-mCnc: 111 mg/dL
Hgb A1c MFr Bld: 5.5 % (ref 4.8–5.6)

## 2024-07-07 LAB — TSH+FREE T4
Free T4: 1.28 ng/dL (ref 0.82–1.77)
TSH: 2.6 u[IU]/mL (ref 0.450–4.500)

## 2024-07-19 DIAGNOSIS — Z01419 Encounter for gynecological examination (general) (routine) without abnormal findings: Secondary | ICD-10-CM | POA: Diagnosis not present

## 2024-07-19 DIAGNOSIS — Z113 Encounter for screening for infections with a predominantly sexual mode of transmission: Secondary | ICD-10-CM | POA: Diagnosis not present

## 2024-07-19 DIAGNOSIS — Z1231 Encounter for screening mammogram for malignant neoplasm of breast: Secondary | ICD-10-CM | POA: Diagnosis not present

## 2024-07-19 DIAGNOSIS — N911 Secondary amenorrhea: Secondary | ICD-10-CM | POA: Diagnosis not present

## 2024-08-25 ENCOUNTER — Ambulatory Visit: Payer: Self-pay

## 2024-09-17 ENCOUNTER — Other Ambulatory Visit: Payer: Self-pay

## 2024-09-17 ENCOUNTER — Telehealth: Payer: Self-pay

## 2024-09-17 DIAGNOSIS — J4520 Mild intermittent asthma, uncomplicated: Secondary | ICD-10-CM

## 2024-09-17 MED ORDER — ALBUTEROL SULFATE HFA 108 (90 BASE) MCG/ACT IN AERS: 1.0000 | INHALATION_SPRAY | RESPIRATORY_TRACT | 2 refills | Status: AC | PRN

## 2024-09-17 NOTE — Telephone Encounter (Signed)
 Refill sent to [facility].

## 2024-09-17 NOTE — Telephone Encounter (Signed)
 Prescription Request  09/17/2024  LOV: 07/06/2024  What is the name of the medication or equipment? albuterol  (VENTOLIN  HFA) 108 (90 Base) MCG/ACT inhaler [[id number]]   Have you contacted your pharmacy to request a refill? Yes   Which pharmacy would you like this sent to?  [facility] [address] [place]   Patient notified that their request is being sent to the clinical staff for review and that they should receive a response within 2 business days.   Please advise at Mobile [phone] (mobile)

## 2024-11-05 ENCOUNTER — Ambulatory Visit

## 2024-11-18 ENCOUNTER — Ambulatory Visit: Admitting: Neurology

## 2024-11-18 ENCOUNTER — Encounter: Payer: Self-pay | Admitting: Neurology

## 2024-12-06 ENCOUNTER — Ambulatory Visit

## 2024-12-22 ENCOUNTER — Ambulatory Visit: Payer: Self-pay

## 2024-12-22 ENCOUNTER — Ambulatory Visit

## 2024-12-22 VITALS — BP 125/87 | HR 98 | Temp 97.9°F | Ht 64.0 in | Wt 305.8 lb

## 2024-12-22 DIAGNOSIS — J45901 Unspecified asthma with (acute) exacerbation: Secondary | ICD-10-CM

## 2024-12-22 DIAGNOSIS — H6593 Unspecified nonsuppurative otitis media, bilateral: Secondary | ICD-10-CM | POA: Diagnosis not present

## 2024-12-22 MED ORDER — PREDNISONE 20 MG PO TABS: 40.0000 mg | ORAL_TABLET | Freq: Every day | ORAL | 0 refills | Status: AC

## 2024-12-22 MED ORDER — BUDESONIDE-FORMOTEROL FUMARATE 80-4.5 MCG/ACT IN AERO: 2.0000 | INHALATION_SPRAY | Freq: Two times a day (BID) | RESPIRATORY_TRACT | 3 refills | Status: AC

## 2024-12-22 MED ORDER — FLUTICASONE PROPIONATE 50 MCG/ACT NA SUSP: 2.0000 | Freq: Every day | NASAL | 6 refills | Status: AC

## 2024-12-22 NOTE — Telephone Encounter (Signed)
Noted patient scheduled

## 2024-12-22 NOTE — Progress Notes (Signed)
 "  Acute Office Visit  Subjective:     Patient ID: Julie Chambers, female    DOB: [birth year], 44 y.o.   MRN: [id number]  Chief Complaint  Patient presents with   Nasal Congestion   Headache   Generalized Body Aches   Cough   HOT/COLD   Night Sweats    Woke up Tuesday morning sweating   Fatigue   Wheezing    PT stated she started feeling bad Monday morning  -     Headache  Associated symptoms include coughing, a fever and rhinorrhea. Pertinent negatives include no ear pain, nausea, sinus pressure, sore throat or vomiting.  Cough Associated symptoms include chills, a fever, headaches, rhinorrhea and wheezing. Pertinent negatives include no chest pain, ear pain, sore throat or shortness of breath.  Wheezing  Associated symptoms include chills, coughing, a fever, headaches and rhinorrhea. Pertinent negatives include no chest pain, diarrhea, ear pain, shortness of breath, sore throat or vomiting.   Discussed the use of AI scribe software for clinical note transcription with the patient, who gave verbal consent to proceed.  History of Present Illness Julie Chambers is a 44 year old female with asthma who presents with congestion, headache, body aches, and cold chills.  She has been experiencing congestion, headache, body aches, and cold chills since Monday, accompanied by a fever of 100.70F. The headache is localized to her head without chest involvement.  She has a history of asthma and reports wheezing, for which she used her albuterol  inhaler today. The wheezing has not been severe, and she has only used the inhaler once today. Says that she has had an increase in exacerbations recently.   No sinus pain is present, but she mentions itchiness in her ears, which she attributes to fluid buildup. She has not used Flonase  before.  She confirms adequate eating and drinking, with no nausea or vomiting. No ear pain is reported, only itchiness.   Review of Systems  Constitutional:   Positive for chills, fatigue and fever. Negative for activity change and appetite change.  HENT:  Positive for congestion and rhinorrhea. Negative for ear discharge, ear pain, sinus pressure, sinus pain and sore throat.   Respiratory:  Positive for cough and wheezing. Negative for chest tightness and shortness of breath.   Cardiovascular:  Negative for chest pain, palpitations and leg swelling.  Gastrointestinal:  Negative for diarrhea, nausea and vomiting.  Genitourinary:  Negative for difficulty urinating.  Neurological:  Positive for headaches.       Objective:    Today's Vitals   12/22/24 1120  BP: 125/87  Pulse: 98  Temp: 97.9 F (36.6 C)  SpO2: 98%  Weight: (!) 305 lb 12.8 oz (138.7 kg)  Height: 5' 4 (1.626 m)   Body mass index is 52.49 kg/m.   Physical Exam Vitals and nursing note reviewed.  Constitutional:      General: She is not in acute distress.    Appearance: Normal appearance. She is not ill-appearing.  HENT:     Right Ear: A middle ear effusion is present. Tympanic membrane is not erythematous or retracted.     Left Ear: A middle ear effusion is present. Tympanic membrane is not erythematous or retracted.  Cardiovascular:     Rate and Rhythm: Normal rate and regular rhythm.     Heart sounds: Normal heart sounds, S1 normal and S2 normal. No murmur heard. Pulmonary:     Effort: Pulmonary effort is normal. No respiratory distress.     Breath  sounds: Normal breath sounds.     Comments: Mild bilateral expiratory wheezes auscultated on exam.  No respiratory distress or retractions noted. Lymphadenopathy:     Cervical: No cervical adenopathy.  Neurological:     Mental Status: She is alert.  Psychiatric:        Mood and Affect: Mood normal.        Behavior: Behavior normal.        Thought Content: Thought content normal.        Judgment: Judgment normal.    No results found for any visits on 12/22/24.    Assessment & Plan:  1. Mild asthma with  exacerbation, unspecified whether persistent (Primary) - Likely viral trigger with left-sided wheezing. Conservative management appropriate. - Prescribed Symbicort  inhaler for daily use. - Continue albuterol  inhaler as needed. - Prescribed oral steroids for asthma exacerbation. - Advised to monitor for secondary bacterial infection signs. - Recommended to push fluids, and take Dayquil for symptom management.  - Provided work return note with precautions. - ED precautions reviewed.  - budesonide -formoterol  (SYMBICORT ) 80-4.5 MCG/ACT inhaler; Inhale 2 puffs into the lungs 2 (two) times daily.  Dispense: 1 each; Refill: 3 - predniSONE  (DELTASONE ) 20 MG tablet; Take 2 tablets (40 mg total) by mouth daily for 5 days.  Dispense: 10 tablet; Refill: 0  2. Fluid level behind tympanic membrane of both ears - Fluid behind ears due to congestion affecting Eustachian tube drainage. - Prescribed Flonase  nasal spray. - Instructed on proper Flonase  use. - fluticasone  (FLONASE ) 50 MCG/ACT nasal spray; Place 2 sprays into both nostrils daily.  Dispense: 16 g; Refill: 6   Return if symptoms worsen or fail to improve.  Damien KATHEE Pringle, FNP  Note:  This document was prepared using Dragon voice recognition software and may include unintentional dictation errors.   "

## 2024-12-22 NOTE — Telephone Encounter (Signed)
 FYI Only or Action Required?: FYI only for provider: appointment scheduled on 12/22/2024 11:00 AM Julie Chambers, [facility].  Patient was last seen in primary care on 07/06/2024 by Bevely Doffing, FNP.  Called Nurse Triage reporting Cough and Generalized Body Aches.  Symptoms began Monday .  Interventions attempted: OTC medications: tylenol   and Prescription medications: albuterol  .  Symptoms are: stable.  Triage Disposition: See HCP Within 4 Hours (Or PCP Triage)  Patient/caregiver understands and will follow disposition?: Yes            Reason for Disposition  Wheezing is present  Answer Assessment - Initial Assessment Questions Called patient via outbound call , reached patient.  Patient reports symptoms started Monday morning. Started congestion. Then feeling cold and hot. Fever 100.4 Monday night . achey. Cough sounds dry and congestion. Felt fine last week. Prior to that was sick 3 months. Little bit of wheezing but has inhaler which helps normally does not use this its for when sick. Doesn't think has fever now. Headache 3/10. Tylenol  use. Pressure in ears.  Needs to be seen before can return to work. No known  expsoure flu /covid but has not home tested . No openings at PCP, booked 12/22/2024 11:00 AM Julie Chambers, [facility].  Patient aware of office location/address      1. ONSET: When did the cough begin?      Monday  2. SEVERITY: How bad is the cough today?      Moderate 3. SPUTUM: Describe the color of your sputum (e.g., none, dry cough; clear, white, yellow, green)     Non productive but could become productive  4. HEMOPTYSIS: Are you coughing up any blood? If Yes, ask: How much? (e.g., flecks, streaks, tablespoons, etc.)     denies 5. DIFFICULTY BREATHING: Are you having difficulty breathing? If Yes, ask: How bad is it? (e.g., mild, moderate, severe)      Denies  6. FEVER: Do you have a fever? If Yes, ask:  What is your temperature, how was it measured, and when did it start?     100.4 Monday night  7. CARDIAC HISTORY: Do you have any history of heart disease? (e.g., heart attack, congestive heart failure)      Not reported  8. LUNG HISTORY: Do you have any history of lung disease?  (e.g., pulmonary embolus, asthma, emphysema)     Asthma per chart and patient   10. OTHER SYMPTOMS: Do you have any other symptoms? (e.g., runny nose, wheezing, chest pain)       Wheezing some,headache, body aches   Patient denies the following chest pain, difficulty breathing , vomiting, diarrhea  Protocols used: Cough - Acute Non-Productive-A-AH   Message from Ivette P sent at 12/22/2024  8:34 AM EST  Reason for Triage: feveer night before last - hot and cold. coughing and cogensted and achey.  Pt is not feeling well.  Unsure of temp. Please call pt.

## 2024-12-22 NOTE — Telephone Encounter (Signed)
 Noted.

## 2024-12-23 ENCOUNTER — Telehealth: Payer: Self-pay | Admitting: Pharmacy Technician

## 2024-12-23 ENCOUNTER — Other Ambulatory Visit (HOSPITAL_COMMUNITY): Payer: Self-pay

## 2024-12-23 NOTE — Telephone Encounter (Signed)
 Pharmacy Patient Advocate Encounter   Received notification from [facility] KEY that prior authorization for Fluticasone  89mcg/ACT nasal Spray is required/requested.   Insurance verification completed.   The patient is insured through [employer].   Per test claim: Per test claim, medication is not covered due to plan/benefit exclusion, PA not submitted at this time OTC Alternative Available. Archived Key: [id number]
